# Patient Record
Sex: Female | Born: 1980
Health system: Southern US, Community
[De-identification: ages and names within clinical notes are randomized; demographics above are authoritative.]

## PROBLEM LIST (undated history)

## (undated) ENCOUNTER — Ambulatory Visit (HOSPITAL_COMMUNITY): Admission: EM | Payer: Federal, State, Local not specified - PPO

## (undated) DIAGNOSIS — Z87442 Personal history of urinary calculi: Secondary | ICD-10-CM

## (undated) DIAGNOSIS — Q394 Esophageal web: Secondary | ICD-10-CM

## (undated) DIAGNOSIS — J45909 Unspecified asthma, uncomplicated: Secondary | ICD-10-CM

## (undated) HISTORY — PX: HERNIA REPAIR: SHX51

---

## 2010-01-10 ENCOUNTER — Other Ambulatory Visit: Admission: RE | Admit: 2010-01-10 | Discharge: 2010-01-10 | Payer: Self-pay | Admitting: Obstetrics and Gynecology

## 2011-02-19 ENCOUNTER — Other Ambulatory Visit (HOSPITAL_COMMUNITY)
Admission: RE | Admit: 2011-02-19 | Discharge: 2011-02-19 | Disposition: A | Discharge status: Home / Self Care | Payer: Managed Care, Other (non HMO) | Source: Ambulatory Visit | Attending: Obstetrics and Gynecology | Admitting: Obstetrics and Gynecology

## 2011-02-19 DIAGNOSIS — Z01419 Encounter for gynecological examination (general) (routine) without abnormal findings: Secondary | ICD-10-CM | POA: Insufficient documentation

## 2011-08-31 ENCOUNTER — Emergency Department (HOSPITAL_COMMUNITY)
Admission: EM | Admit: 2011-08-31 | Discharge: 2011-09-01 | Disposition: A | Discharge status: Home / Self Care | Attending: Emergency Medicine | Admitting: Emergency Medicine

## 2011-08-31 ENCOUNTER — Encounter (HOSPITAL_COMMUNITY): Payer: Self-pay | Admitting: *Deleted

## 2011-08-31 DIAGNOSIS — Q391 Atresia of esophagus with tracheo-esophageal fistula: Secondary | ICD-10-CM | POA: Insufficient documentation

## 2011-08-31 DIAGNOSIS — R1310 Dysphagia, unspecified: Secondary | ICD-10-CM | POA: Insufficient documentation

## 2011-08-31 DIAGNOSIS — Q394 Esophageal web: Secondary | ICD-10-CM

## 2011-08-31 MED ORDER — NITROGLYCERIN 0.4 MG SL SUBL
0.4000 mg | SUBLINGUAL_TABLET | Freq: Once | SUBLINGUAL | Status: AC
Start: 2011-08-31 — End: 2011-08-31
  Administered 2011-08-31: 0.4 mg via SUBLINGUAL
  Filled 2011-08-31: qty 25

## 2011-08-31 NOTE — ED Notes (Signed)
No difference in her swallowing ability

## 2011-08-31 NOTE — ED Notes (Signed)
Patient has history of same in the past with eating a random food and she immediately threw up.  Patient states that in the past the swallowing problem would go away and today it has been a problem since 5pm and that is longer than normal.

## 2011-08-31 NOTE — ED Provider Notes (Signed)
History     CSN: [id number]  Arrival date & time 08/31/11  2123   None     Chief Complaint  Patient presents with  . Dysphagia    (Consider location/radiation/quality/duration/timing/severity/associated sxs/prior treatment) HPI Comments: Patient reports having intermittent episodes of esophageal spasm-like symptoms over the past several years.  At first he thought it was related to lying or beer consumption.  She has stopped that but now it has happened with foods tonight.  She was eating chicken nuggets, and suddenly had spasm, where she could not swallow even her own saliva.  It is painful.  She can swallow, but she feels that this is if there is something "sticking in her throat" and it is painful to swallow.  She has not seen any medical provider for this particular symptom.  She denies choking on her food, prior to this episode.  She did not have a sore throat, fever, cough, nausea, vomiting  The history is provided by the patient.    History reviewed. No pertinent past medical history.  History reviewed. No pertinent past surgical history.  History reviewed. No pertinent family history.  History  Substance Use Topics  . Smoking status: Never Smoker   . Smokeless tobacco: Not on file  . Alcohol Use: Yes    OB History    Grav Para Term Preterm Abortions TAB SAB Ect Mult Living                  Review of Systems  Constitutional: Negative for fever and chills.  HENT: Positive for trouble swallowing. Negative for sore throat, rhinorrhea, drooling and voice change.   Respiratory: Negative for cough, choking and shortness of breath.   Cardiovascular: Negative for chest pain.  Neurological: Negative for dizziness and weakness.  Hematological: Negative for adenopathy.    Allergies  Review of patient's allergies indicates no known allergies.  Home Medications  No current outpatient prescriptions on file.  BP 128/76  Pulse 60  Temp(Src) 98.9 F (37.2 C) (Oral)   Resp 20  SpO2 100%  LMP 09/01/2011  Physical Exam  Constitutional: She is oriented to person, place, and time. She appears well-developed and well-nourished.       Patient continuously spitting into a cup.  When asking her to swallow her own saliva.  She can, but she states it hurts  HENT:  Head: Normocephalic. No trismus in the jaw.  Mouth/Throat: Uvula is midline, oropharynx is clear and moist and mucous membranes are normal. No uvula swelling. No oropharyngeal exudate or posterior oropharyngeal erythema.  Eyes: Pupils are equal, round, and reactive to light.  Cardiovascular: Normal rate.   Pulmonary/Chest: Effort normal.  Musculoskeletal: Normal range of motion.  Neurological: She is alert and oriented to person, place, and time.  Skin: Skin is warm and dry.    ED Course  Procedures (including critical care time)  Labs Reviewed - No data to display Dg Neck Soft Tissue  09/01/2011  *RADIOLOGY REPORT*  Clinical Data: Difficulty swallowing after choking on a chicken nugget 1 day ago.  NECK SOFT TISSUES - 1+ VIEW  Comparison: None.  Findings: No radiopaque foreign bodies demonstrated in the neck. Visualized hypopharynx and cervical airway appear intact. Aryepiglottic fold thickening is not excluded.  No prevertebral or submental soft tissue swelling.  IMPRESSION: No radiopaque foreign bodies demonstrated.  Original Report Authenticated By: Marlon Pel, M.D.   Dg Esophagus  09/01/2011  *RADIOLOGY REPORT*  Clinical Data: Choking sensation yesterday with subsequent pain and  inability to swallow.  BARIUM SWALLOW/ESOPHAGRAM  Technique: Single contrast esophagram was obtained with water- soluble contrast material.  Images were obtained in the upright and supine positions.  Comparison:  None.  Findings: Normal primary esophageal peristalsis with free flow of contrast material throughout the cervical esophagus to the distal esophagus and into the stomach.  No evidence of focal obstruction. No  intraluminal filling defects are demonstrated.  There is suggestion of a small anterior filling defect in the proximal cervical esophagus which could represent a small cervical web. Normal esophageal fold pattern.  No evidence of ulceration or esophageal mass.  IMPRESSION: No evidence of esophageal obstruction or dysmotility.  Small anterior defect in the upper cervical esophagus might represent a small cervical web.  Original Report Authenticated By: Marlon Pel, M.D.     1. Esophageal tissue web       MDM  I believe that this is esophageal, spasm.  We'll try a sublingual nitroglycerin and reassess patient Patient had no relief with the sublingual nitroglycerin.  She still has persisted in spitting saliva.  Will ask that an IV be placed administer Protonix and a half a milligram of Ativan and,Solumederol  then we'll reassess Spoke with DR. 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  [facility], encourage her to eat pured or liquid food until she has further evaluation to prevent food boluses or similar situations as tonight She is now drinking fluids without difficulty        Arman Filter, NP 09/01/11 [phone]

## 2011-08-31 NOTE — ED Notes (Signed)
The pt  Has been given sl nitro

## 2011-08-31 NOTE — ED Notes (Signed)
The pt has had difficulty swallowing since 1700.  She has had this in the past and she cannot swallow her saliva

## 2011-09-01 ENCOUNTER — Emergency Department (HOSPITAL_COMMUNITY)

## 2011-09-01 MED ORDER — PANTOPRAZOLE SODIUM 40 MG IV SOLR
40.0000 mg | Freq: Once | INTRAVENOUS | Status: AC
  Administered 2011-09-01: 40 mg via INTRAVENOUS
  Filled 2011-09-01: qty 40

## 2011-09-01 MED ORDER — LORAZEPAM 2 MG/ML IJ SOLN
0.5000 mg | Freq: Once | INTRAMUSCULAR | Status: AC
  Administered 2011-09-01: 2 mg via INTRAVENOUS
  Filled 2011-09-01: qty 1

## 2011-09-01 MED ORDER — METHYLPREDNISOLONE SODIUM SUCC 125 MG IJ SOLR
125.0000 mg | Freq: Once | INTRAMUSCULAR | Status: AC
  Administered 2011-09-01: 125 mg via INTRAVENOUS
  Filled 2011-09-01: qty 2

## 2011-09-01 MED ORDER — SODIUM CHLORIDE 0.9 % IV BOLUS (SEPSIS)
1000.0000 mL | Freq: Once | INTRAVENOUS | Status: AC
  Administered 2011-09-01: 1000 mL via INTRAVENOUS

## 2011-09-01 MED ORDER — GLUCAGON HCL (RDNA) 1 MG IJ SOLR
1.0000 mg | Freq: Once | INTRAMUSCULAR | Status: AC
  Administered 2011-09-01: 1 mg via INTRAVENOUS
  Filled 2011-09-01: qty 1

## 2011-09-01 NOTE — ED Notes (Signed)
Pt st's she still is unable to swallow and continues to spit in cup.  No resp. Problems noted

## 2011-09-01 NOTE — ED Provider Notes (Signed)
Medical screening examination/treatment/procedure(s) were performed by non-physician practitioner and as supervising physician I was immediately available for consultation/collaboration.    Diann Bangerter R Climmie Buelow, MD 09/01/11 0631 

## 2011-09-01 NOTE — ED Notes (Signed)
Pt states understanding of discharge instructions 

## 2011-09-01 NOTE — ED Notes (Signed)
GLucagon admx  ivp. Site is benign. Pt continues to not tolerate ice, advised to no longer eat ice. FNP at bedside and explaining plan of care. Pt continues to converses with ease. Friend at bedside.

## 2014-03-01 ENCOUNTER — Other Ambulatory Visit: Payer: Self-pay | Admitting: Emergency Medicine

## 2014-03-01 DIAGNOSIS — R531 Weakness: Secondary | ICD-10-CM

## 2014-03-08 ENCOUNTER — Ambulatory Visit
Admission: RE | Admit: 2014-03-08 | Discharge: 2014-03-08 | Disposition: A | Discharge status: Home / Self Care | Source: Ambulatory Visit | Attending: Emergency Medicine | Admitting: Emergency Medicine

## 2014-03-08 DIAGNOSIS — R531 Weakness: Secondary | ICD-10-CM

## 2014-03-08 MED ORDER — GADOBENATE DIMEGLUMINE 529 MG/ML IV SOLN
13.0000 mL | Freq: Once | INTRAVENOUS | Status: AC | PRN
  Administered 2014-03-08: 13 mL via INTRAVENOUS

## 2015-07-07 ENCOUNTER — Emergency Department (HOSPITAL_COMMUNITY)
Admission: EM | Admit: 2015-07-07 | Discharge: 2015-07-07 | Disposition: A | Discharge status: Home / Self Care | Payer: Federal, State, Local not specified - PPO | Attending: Emergency Medicine | Admitting: Emergency Medicine

## 2015-07-07 ENCOUNTER — Encounter (HOSPITAL_COMMUNITY): Payer: Self-pay | Admitting: Emergency Medicine

## 2015-07-07 DIAGNOSIS — Y9289 Other specified places as the place of occurrence of the external cause: Secondary | ICD-10-CM | POA: Insufficient documentation

## 2015-07-07 DIAGNOSIS — Q394 Esophageal web: Secondary | ICD-10-CM | POA: Diagnosis not present

## 2015-07-07 DIAGNOSIS — Y9389 Activity, other specified: Secondary | ICD-10-CM | POA: Diagnosis not present

## 2015-07-07 DIAGNOSIS — F419 Anxiety disorder, unspecified: Secondary | ICD-10-CM | POA: Diagnosis not present

## 2015-07-07 DIAGNOSIS — R1310 Dysphagia, unspecified: Secondary | ICD-10-CM | POA: Diagnosis present

## 2015-07-07 DIAGNOSIS — T18128A Food in esophagus causing other injury, initial encounter: Secondary | ICD-10-CM | POA: Diagnosis not present

## 2015-07-07 DIAGNOSIS — X58XXXA Exposure to other specified factors, initial encounter: Secondary | ICD-10-CM | POA: Diagnosis not present

## 2015-07-07 DIAGNOSIS — Y998 Other external cause status: Secondary | ICD-10-CM | POA: Insufficient documentation

## 2015-07-07 DIAGNOSIS — K222 Esophageal obstruction: Secondary | ICD-10-CM

## 2015-07-07 HISTORY — DX: Esophageal web: Q39.4

## 2015-07-07 MED ORDER — ONDANSETRON HCL 4 MG/2ML IJ SOLN
4.0000 mg | Freq: Once | INTRAMUSCULAR | Status: AC
  Administered 2015-07-07: 4 mg via INTRAVENOUS
  Filled 2015-07-07: qty 2

## 2015-07-07 MED ORDER — ONDANSETRON 4 MG PO TBDP
4.0000 mg | ORAL_TABLET | Freq: Three times a day (TID) | ORAL | Status: DC | PRN
Start: 2015-07-07 — End: 2016-04-07

## 2015-07-07 MED ORDER — NITROGLYCERIN 0.4 MG SL SUBL
0.4000 mg | SUBLINGUAL_TABLET | Freq: Once | SUBLINGUAL | Status: AC
  Administered 2015-07-07: 0.4 mg via SUBLINGUAL
  Filled 2015-07-07: qty 1

## 2015-07-07 MED ORDER — SODIUM CHLORIDE 0.9 % IV SOLN
INTRAVENOUS | Status: DC
  Administered 2015-07-07: 02:00:00 via INTRAVENOUS

## 2015-07-07 MED ORDER — PANTOPRAZOLE SODIUM 40 MG PO TBEC: 40.0000 mg | DELAYED_RELEASE_TABLET | Freq: Every day | ORAL | Status: DC

## 2015-07-07 MED ORDER — PANTOPRAZOLE SODIUM 40 MG IV SOLR
40.0000 mg | Freq: Once | INTRAVENOUS | Status: AC
  Administered 2015-07-07: 40 mg via INTRAVENOUS
  Filled 2015-07-07: qty 40

## 2015-07-07 MED ORDER — GLUCAGON HCL RDNA (DIAGNOSTIC) 1 MG IJ SOLR
1.0000 mg | Freq: Once | INTRAMUSCULAR | Status: AC
  Administered 2015-07-07: 1 mg via INTRAVENOUS
  Filled 2015-07-07: qty 1

## 2015-07-07 NOTE — Discharge Instructions (Signed)
Soft-Food Meal Plan A soft-food meal plan includes foods that are safe and easy to swallow. This meal plan typically is used:  If you are having trouble chewing or swallowing foods.  As a transition meal plan after only having had liquid meals for a long period. WHAT DO I NEED TO KNOW ABOUT THE SOFT-FOOD MEAL PLAN? A soft-food meal plan includes tender foods that are soft and easy to chew and swallow. In most cases, bite-sized pieces of food are easier to swallow. A bite-sized piece is about  inch or smaller. Foods in this plan do not need to be ground or pureed. Foods that are very hard, crunchy, or sticky should be avoided. Also, breads, cereals, yogurts, and desserts with nuts, seeds, or fruits should be avoided. WHAT FOODS CAN I EAT? Grains Rice and wild rice. Moist bread, dressing, pasta, and noodles. Well-moistened dry or cooked cereals, such as farina (cooked wheat cereal), oatmeal, or grits. Biscuits, breads, muffins, pancakes, and waffles that have been well moistened. Vegetables Shredded lettuce. Cooked, tender vegetables, including potatoes without skins. Vegetable juices. Broths or creamed soups made with vegetables that are not stringy or chewy. Strained tomatoes (without seeds). Fruits Canned or well-cooked fruits. Soft (ripe), peeled fresh fruits, such as peaches, nectarines, kiwi, cantaloupe, honeydew melon, and watermelon (without seeds). Soft berries with small seeds, such as strawberries. Fruit juices (without pulp). Meats and Other Protein Sources Moist, tender, lean beef. Mutton. Lamb. Veal. Chicken. Turkey. Liver. Ham.[place] Fish without bones. Eggs. Dairy Milk, milk drinks, and cream. Plain cream cheese and cottage cheese. Plain yogurt. Sweets/Desserts Flavored gelatin desserts. Custard. Plain ice cream, frozen yogurt, sherbet, milk shakes, and malts. Plain cakes and cookies. Plain hard candy.  Other Butter, margarine (without trans fat), and cooking oils. Mayonnaise. Cream  sauces. Mild spices, salt, and sugar. Syrup, molasses, honey, and jelly. The items listed above may not be a complete list of recommended foods or beverages. Contact your dietitian for more options. WHAT FOODS ARE NOT RECOMMENDED? Grains Dry bread, toast, crackers that have not been moistened. Coarse or dry cereals, such as bran, granola, and shredded wheat. Tough or chewy crusty breads, such as [place] bread or baguettes. Vegetables Corn. Raw vegetables except shredded lettuce. Cooked vegetables that are tough or stringy. Tough, crisp, fried potatoes and potato skins. Fruits Fresh fruits with skins or seeds or both, such as apples, pears, or grapes. Stringy, high-pulp fruits, such as papaya, pineapple, coconut, or mango. Fruit leather, fruit roll-ups, and all dried fruits. Meats and Other Protein Sources Sausages and hot dogs. Meats with gristle. Fish with bones. Nuts, seeds, and chunky peanut or other nut butters. Sweets/Desserts Cakes or cookies that are very dry or chewy.  The items listed above may not be a complete list of foods and beverages to avoid. Contact your dietitian for more information.   This information is not intended to replace advice given to you by your health care provider. Make sure you discuss any questions you have with your health care provider.   Document Released: 10/29/2007 Document Revised: 07/27/2013 Document Reviewed: 06/18/2013 Elsevier Interactive Patient Education 2016 [employer]   Swallowed Foreign Body, Adult/Food Bolus A swallowed foreign body is an object that gets stuck in the tube that connects your throat to your stomach (esophagus) or in another part of your digestive tract. Foreign bodies may be swallowed by accident or on purpose. When you swallow an object, it passes into your esophagus. The narrowest place in your digestive system is where your  esophagus meets your stomach. If the object can pass through that place, it will usually continue  through the rest of your digestive system without causing problems. A foreign body that gets stuck may need to be removed. It is very important to tell your health care provider what you have swallowed. Certain swallowed items can be life-threatening. You may need emergency treatment. Dangerous swallowed foreign bodies include:  Objects that get stuck in your throat.  Objects that interfere with your breathing.  Sharp objects.  Harmful objects, such as batteries or illegal drugs. CAUSES The most common swallowed foreign body is food that will not pass through your esophagus to your stomach (food impaction). Foods that commonly become impacted include meats and hard vegetables, such as carrots and radishes. Other common swallowed foreign bodies include:  Pieces of bone from meats.  Toothpicks.  Dentures. RISK FACTORS You are more likely to have a swallowed foreign body if:  You wear dentures.  You have been drinking alcohol or taking drugs.  You have a mental health condition.  You have a narrowed or scarred area in your digestive tract. SYMPTOMS  Pain or pressure in your throat or chest.  Not being able to swallow food or liquid.  Not being able to swallow your saliva.  Choking.  Hoarse voice.  Trouble breathing. DIAGNOSIS This condition may be diagnosed based on your symptoms and medical history. Your health care provider will do a physical exam to confirm the diagnosis and to find the object. Imaging studies may be done, including:  X-rays.  A CT scan. Some objects may not be seen on imaging studies. In those cases, an exam may be done using a long tubelike scope to look into your esophagus (endoscopy). The tube (endoscope) that is used for this exam may be stiff (rigid) or flexible, depending on where the foreign body is stuck. TREATMENT Usually, an object that has passed into your stomach but is not dangerous will pass out of your digestive system without  treatment. If the swallowed object is not dangerous but it is stuck in your esophagus:  You may be given medicine to relax the muscles of your esophagus to allow the object to pass through.  Endoscopy may be done to find and remove the object if it does not pass with medicine. Your health care provider will put medical instruments through the endoscope to remove the object. You may need emergency treatment if:  The object is in your esophagus and is causing you to inhale saliva into your lungs (aspirate).  The object is in your esophagus and is pressing on your airway. This makes it hard for you to breathe.  The object can damage your digestive tract. Some objects that can cause damage include batteries, magnets, sharp objects, and drugs. HOME CARE INSTRUCTIONS If the object in your digestive system is expected to pass:  Continue eating what you usually eat, unless your health care provider gives you different instructions.  Check your stool after every bowel movement to see if you have passed the object.  Contact your health care provider if the object has not passed after 3 days. If you had endoscopic surgery to remove the foreign body:  Follow instructions from your health care provider about caring for yourself after the procedure. Keep all follow-up visits and repeat imaging tests as told by your health care provider. This is important. SEEK MEDICAL CARE IF:  You continue to have symptoms of a swallowed foreign body.  The object  has not passed out of your body after 3 days. SEEK IMMEDIATE MEDICAL CARE IF:  You have a fever.  You have pain in your chest or your abdomen.  You cough up blood.  You have blood in your stool (feces) or your vomit.   This information is not intended to replace advice given to you by your health care provider. Make sure you discuss any questions you have with your health care provider.   Document Released: 01/09/2010 Document Revised:  04/12/2015 Document Reviewed: 10/19/2014 Elsevier Interactive Patient Education [employer]

## 2015-07-07 NOTE — ED Notes (Signed)
Tolerating ice chips well, given water

## 2015-07-07 NOTE — ED Provider Notes (Signed)
By signing my name below, I, Tanda Rockers, attest that this documentation has been prepared under the direction and in the presence of [employer], DO. Electronically Signed: Tanda Rockers, ED Scribe. 07/07/2015. 1:37 AM.  TIME SEEN: 1:29 AM  CHIEF COMPLAINT: Sore throat, unable to swallow  HPI:  Sheila Graham is a 34 y.o. female with hx esophageal web diagnosed on esophagram in 2013 who presents to the Emergency Department complaining of a sudden onset, constant, sensation of food stuck in her throat that began tonight at 7 PM (approximately 6.5 hours ago) while eating a fajita. Pt reports hx of similar episodes for the past several years. She usually self induces vomiting which gives her relief. Pt has tried to make herself vomit tonight without relief. She is also unable to tolerate her secretions, prompting her to come to the ED tonight. Denies shortness of breath or any other associated symptoms. No changes in her voice. Pt does not follow with a GI. No history of endoscopy.   ROS: See HPI Constitutional: no fever  Eyes: no drainage  ENT: foreign body sensation in throat. no runny nose   Cardiovascular:  no chest pain  Resp: no SOB  GI: no vomiting GU: no dysuria Integumentary: no rash  Allergy: no hives  Musculoskeletal: no leg swelling  Neurological: no slurred speech ROS otherwise negative  PAST MEDICAL HISTORY/PAST SURGICAL HISTORY:  Past Medical History  Diagnosis Date  . Esophageal web     MEDICATIONS:  Prior to Admission medications   Not on File    ALLERGIES:  No Known Allergies  SOCIAL HISTORY:  Social History  Substance Use Topics  . Smoking status: Never Smoker   . Smokeless tobacco: Not on file  . Alcohol Use: Yes    FAMILY HISTORY: No family history on file.  EXAM: Triage Vitals: BP 162/98 mmHg  Pulse 80  Temp(Src) 97.8 F (36.6 C) (Oral)  Resp 16  SpO2 100%  LMP 06/16/2015 (Approximate)   CONSTITUTIONAL: Alert and oriented and  responds appropriately to questions. Well-appearing; well-nourished. Appears anxious.  HEAD: Normocephalic EYES: Conjunctivae clear, PERRL ENT: normal nose; no rhinorrhea; moist mucous membranes; pharynx without lesions noted, no tonsillar hypertrophy or exudate, normal phonation and no stridor, pt unable to swallow her own secretions and is actively spitting, no trismus or drooling NECK: Supple, no meningismus, no LAD  CARD: RRR; S1 and S2 appreciated; no murmurs, no clicks, no rubs, no gallops RESP: Normal chest excursion without splinting or tachypnea; breath sounds clear and equal bilaterally; no wheezes, no rhonchi, no rales, no hypoxia or respiratory distress, speaking full sentences ABD/GI: Normal bowel sounds; non-distended; soft, non-tender, no rebound, no guarding, no peritoneal signs BACK:  The back appears normal and is non-tender to palpation, there is no CVA tenderness EXT: Normal ROM in all joints; non-tender to palpation; no edema; normal capillary refill; no cyanosis, no calf tenderness or swelling    SKIN: Normal color for age and race; warm NEURO: Moves all extremities equally, sensation to light touch intact diffusely, cranial nerves II through XII intact PSYCH: The patient's mood and manner are appropriate. Grooming and personal hygiene are appropriate.  MEDICAL DECISION MAKING: Patient here with likely esophageal food bolus. We'll give nitroglycerin, glucagon, Protonix, Zofran and fluids. No respiratory distress. Normal phonation.  ED PROGRESS: 3:00 AM  Pt reports feeling much better after nitroglycerin, Protonix, Zofran and glucagon. She is now able to swallow her saliva and is drinking without difficulty. We'll discharge patient home with prescriptions  for Protonix and Zofran to use as needed and outpatient gastroenterology follow-up information. Discussed return precautions. She verbalized understanding and is comfortable with this plan.    I personally performed the  services described in this documentation, which was scribed in my presence. The recorded information has been reviewed and is accurate.    Layla MawKristen N Marytza Grandpre, DO 07/07/15 [phone]

## 2015-07-07 NOTE — ED Notes (Signed)
Pt states that her throat feels much better, pt given ice chips, tolerating well.

## 2015-07-07 NOTE — ED Notes (Signed)
Pt. reports sore throat / hard to swallow intermittently every week and this evening , pt. states history of " esophageal web/net" , airway intact / respirations unlabored .

## 2016-04-07 ENCOUNTER — Encounter (HOSPITAL_COMMUNITY)
Admission: EM | Disposition: A | Discharge status: Home / Self Care | Payer: Self-pay | Source: Home / Self Care | Attending: Emergency Medicine

## 2016-04-07 ENCOUNTER — Ambulatory Visit (HOSPITAL_COMMUNITY)
Admission: EM | Admit: 2016-04-07 | Discharge: 2016-04-08 | Disposition: A | Discharge status: Home / Self Care | Payer: Federal, State, Local not specified - PPO | Attending: Emergency Medicine | Admitting: Emergency Medicine

## 2016-04-07 ENCOUNTER — Encounter (HOSPITAL_COMMUNITY): Payer: Self-pay | Admitting: Emergency Medicine

## 2016-04-07 DIAGNOSIS — K449 Diaphragmatic hernia without obstruction or gangrene: Secondary | ICD-10-CM | POA: Insufficient documentation

## 2016-04-07 DIAGNOSIS — K209 Esophagitis, unspecified: Secondary | ICD-10-CM | POA: Insufficient documentation

## 2016-04-07 DIAGNOSIS — X58XXXA Exposure to other specified factors, initial encounter: Secondary | ICD-10-CM | POA: Insufficient documentation

## 2016-04-07 DIAGNOSIS — T18128A Food in esophagus causing other injury, initial encounter: Secondary | ICD-10-CM

## 2016-04-07 DIAGNOSIS — K222 Esophageal obstruction: Secondary | ICD-10-CM | POA: Diagnosis not present

## 2016-04-07 HISTORY — PX: ESOPHAGOGASTRODUODENOSCOPY: SHX5428

## 2016-04-07 LAB — I-STAT CHEM 8, ED
BUN: 18 mg/dL (ref 6–20)
Calcium, Ion: 1.18 mmol/L (ref 1.15–1.40)
Chloride: 104 mmol/L (ref 101–111)
Creatinine, Ser: 1 mg/dL (ref 0.44–1.00)
Glucose, Bld: 80 mg/dL (ref 65–99)
HCT: 46 % (ref 36.0–46.0)
Hemoglobin: 15.6 g/dL — ABNORMAL HIGH (ref 12.0–15.0)
Potassium: 3.9 mmol/L (ref 3.5–5.1)
Sodium: 139 mmol/L (ref 135–145)
TCO2: 26 mmol/L (ref 0–100)

## 2016-04-07 LAB — I-STAT BETA HCG BLOOD, ED (MC, WL, AP ONLY): I-stat hCG, quantitative: <5 m[IU]/mL (ref <5)

## 2016-04-07 SURGERY — EGD (ESOPHAGOGASTRODUODENOSCOPY)
Anesthesia: Moderate Sedation

## 2016-04-07 MED ORDER — DIPHENHYDRAMINE HCL 50 MG/ML IJ SOLN
INTRAMUSCULAR | Status: DC | PRN
  Administered 2016-04-07 (×2): 12.5 mg via INTRAVENOUS

## 2016-04-07 MED ORDER — ONDANSETRON HCL 4 MG/2ML IJ SOLN
4.0000 mg | Freq: Once | INTRAMUSCULAR | Status: AC
  Administered 2016-04-07: 4 mg via INTRAVENOUS
  Filled 2016-04-07: qty 2

## 2016-04-07 MED ORDER — MIDAZOLAM HCL 10 MG/2ML IJ SOLN
INTRAMUSCULAR | Status: DC | PRN
  Administered 2016-04-07: 2 mg via INTRAVENOUS
  Administered 2016-04-07: 1 mg via INTRAVENOUS
  Administered 2016-04-07 (×3): 2 mg via INTRAVENOUS

## 2016-04-07 MED ORDER — PANTOPRAZOLE SODIUM 40 MG IV SOLR: 40.0000 mg | Freq: Two times a day (BID) | INTRAVENOUS | Status: DC

## 2016-04-07 MED ORDER — DIPHENHYDRAMINE HCL 50 MG/ML IJ SOLN
INTRAMUSCULAR | Status: AC
  Filled 2016-04-07: qty 1

## 2016-04-07 MED ORDER — SODIUM CHLORIDE 0.9 % IV BOLUS (SEPSIS)
1000.0000 mL | Freq: Once | INTRAVENOUS | Status: AC
  Administered 2016-04-07: 1000 mL via INTRAVENOUS

## 2016-04-07 MED ORDER — FENTANYL CITRATE (PF) 100 MCG/2ML IJ SOLN
INTRAMUSCULAR | Status: DC | PRN
  Administered 2016-04-07 (×4): 25 ug via INTRAVENOUS

## 2016-04-07 MED ORDER — MIDAZOLAM HCL 5 MG/ML IJ SOLN
INTRAMUSCULAR | Status: AC
  Filled 2016-04-07: qty 2

## 2016-04-07 MED ORDER — GLUCAGON HCL RDNA (DIAGNOSTIC) 1 MG IJ SOLR
1.0000 mg | Freq: Once | INTRAMUSCULAR | Status: AC
  Administered 2016-04-07: 1 mg via INTRAVENOUS
  Filled 2016-04-07: qty 1

## 2016-04-07 MED ORDER — DIAZEPAM 5 MG/ML IJ SOLN
5.0000 mg | Freq: Once | INTRAMUSCULAR | Status: AC
  Administered 2016-04-07: 5 mg via INTRAVENOUS
  Filled 2016-04-07: qty 2

## 2016-04-07 MED ORDER — BUTAMBEN-TETRACAINE-BENZOCAINE 2-2-14 % EX AERO
INHALATION_SPRAY | CUTANEOUS | Status: DC | PRN
  Administered 2016-04-07: 2 via TOPICAL

## 2016-04-07 MED ORDER — FENTANYL CITRATE (PF) 100 MCG/2ML IJ SOLN
INTRAMUSCULAR | Status: AC
  Filled 2016-04-07: qty 2

## 2016-04-07 NOTE — ED Notes (Signed)
Pt states she frequently gets food boluses stuck, states about once per week. States she's usually able to get it up through inducing vomiting or with "fizzy" beverages. States neither technique worked this time. States she has been told she needs an endoscopic procedure.

## 2016-04-07 NOTE — ED Notes (Signed)
MD at bedside. 

## 2016-04-07 NOTE — ED Triage Notes (Signed)
Patient was at a [employer] today, swallowed pork that has been lodged in esophagus.  Patient is unable to swallow saliva.  Patient has no problems breathing at this time.

## 2016-04-07 NOTE — ED Provider Notes (Signed)
[facility] DEPT Provider Note   CSN: [id number] Arrival date & time: 04/07/16  1950    History   Chief Complaint Chief Complaint  Patient presents with  . Foreign Body    HPI Sheila Graham is a 35 y.o. female.  HPI  Patient is a 35 year old female with a past history of stuck food boluses who comes in today complaining of a stuck food bolus. Patient states she was at a potluck when she swallowed a piece of barbecue which she states feels it is lodged in her esophagus. Patient's been unable to swallow any saliva since that time. Patient denies any other symptoms.  Past Medical History:  Diagnosis Date  . Esophageal web     There are no active problems to display for this patient.   Past Surgical History:  Procedure Laterality Date  . ESOPHAGOGASTRODUODENOSCOPY N/A 04/07/2016   Procedure: ESOPHAGOGASTRODUODENOSCOPY (EGD);  Surgeon: Kathi DerParag Brahmbhatt, MD;  [facility];  Service: Gastroenterology;  Laterality: N/A;    OB History    No data available       Home Medications    Prior to Admission medications   Medication Sig Start Date End Date Taking? Authorizing Provider  naproxen sodium (ANAPROX) 220 MG tablet Take 220-440 mg by mouth 2 (two) times daily as needed (headache).   Yes Historical Provider, MD  pantoprazole (PROTONIX) 40 MG tablet Take 1 tablet (40 mg total) by mouth 2 (two) times daily. 04/08/16   Tomasita CrumbleAdeleke Oni, MD    Family History History reviewed. No pertinent family history.  Social History Social History  Substance Use Topics  . Smoking status: Never Smoker  . Smokeless tobacco: Never Used  . Alcohol use Yes     Allergies   Review of patient's allergies indicates no known allergies.   Review of Systems Review of Systems  Constitutional: Negative for chills and fever.  HENT: Positive for trouble swallowing. Negative for congestion and facial swelling.   Respiratory: Negative for shortness of breath.   Gastrointestinal: Negative  for abdominal pain.  All other systems reviewed and are negative.    Physical Exam Updated Vital Signs BP (!) 96/54   Pulse 79   Temp 98.7 F (37.1 C) (Oral)   Resp 19   LMP 03/24/2016 (Approximate)   SpO2 99%   Physical Exam  Constitutional: She appears well-developed and well-nourished. No distress.  HENT:  Head: Normocephalic and atraumatic.  Eyes: Conjunctivae are normal.  Neck: Neck supple.  Cardiovascular: Normal rate and regular rhythm.   No murmur heard. Pulmonary/Chest: Effort normal and breath sounds normal. No respiratory distress.  Abdominal: Soft. There is no tenderness.  Musculoskeletal: She exhibits no edema.  Neurological: She is alert.  Skin: Skin is warm and dry.  Psychiatric: She has a normal mood and affect.  Nursing note and vitals reviewed.    ED Treatments / Results  Labs (all labs ordered are listed, but only abnormal results are displayed) Labs Reviewed  I-STAT CHEM 8, ED - Abnormal; Notable for the following:       Result Value   Hemoglobin 15.6 (*)    All other components within normal limits  I-STAT BETA HCG BLOOD, ED ([facility], [facility], [facility] ONLY)  SURGICAL PATHOLOGY    EKG  EKG Interpretation None       Radiology No results found.  Procedures Procedures (including critical care time)  Medications Ordered in ED Medications  sodium chloride 0.9 % bolus 1,000 mL (0 mLs Intravenous Stopped 04/07/16 2243)  glucagon (human  recombinant) (GLUCAGEN) injection 1 mg (1 mg Intravenous Given 04/07/16 2116)  ondansetron [facility]) injection 4 mg (4 mg Intravenous Given 04/07/16 2115)  diazepam (VALIUM) injection 5 mg (5 mg Intravenous Given 04/07/16 2142)     Initial Impression / Assessment and Plan / ED Course  I have reviewed the triage vital signs and the nursing notes.  Pertinent labs & imaging results that were available during my care of the patient were reviewed by me and considered in my medical decision making (see chart for  details).  Clinical Course  Comment By Time  Pt seen and examined.  Hx of recurrent dysphagia.  Managed at home.  Now it has not passed pat at all since 6pm.   Linwood Dibbles, MD 09/03 2108    Patient is a 35 year old female with a past history of stuck food boluses who comes in today complaining of a stuck food bolus. Patient states she was at a potluck when she swallowed a piece of barbecue which she states feels it is lodged in her esophagus. Patient's been unable to swallow any saliva since that time. Patient denies any other symptoms.  PE: WNL  We'll give patient Valium and glucagon.  Pt did not have relief from above. GI consulted and have agreed to admit for EGD.  Final Clinical Impressions(s) / ED Diagnoses   Final diagnoses:  Food impaction of esophagus, initial encounter    New Prescriptions Discharge Medication List as of 04/08/2016 12:12 AM    START taking these medications   Details  pantoprazole (PROTONIX) 40 MG tablet Take 1 tablet (40 mg total) by mouth 2 (two) times daily., Starting Mon 04/08/2016, Print         Caren Griffins, MD 04/10/16 1610    Linwood Dibbles, MD 04/11/16 [id number]

## 2016-04-07 NOTE — ED Notes (Signed)
Endo at bedside

## 2016-04-07 NOTE — Op Note (Signed)
[facility] Patient Name: Sheila Graham Procedure Date : 04/07/2016 MRN: [id number] Attending MD: Kathi Der , MD Date of Birth: Nov 03, 1980 CSN: [id number] Age: 35 Admit Type: Outpatient Procedure:                Upper GI endoscopy Indications:              Foreign body in the esophagus Providers:                Kathi Der, MD, Sarah Monday RN, RN, Lorenda Ishihara, Technician Referring MD:              Medicines:                Fentanyl 100 micrograms IV, Midazolam 9 mg IV,                            Diphenhydramine 25 mg IV Complications:            No immediate complications. Estimated Blood Loss:     Estimated blood loss: none. Procedure:                Pre-Anesthesia Assessment:                           - Prior to the procedure, a History and Physical                            was performed, and patient medications and                            allergies were reviewed. The patient's tolerance of                            previous anesthesia was also reviewed. The risks                            and benefits of the procedure and the sedation                            options and risks were discussed with the patient.                            All questions were answered, and informed consent                            was obtained. Prior Anticoagulants: The patient has                            taken no previous anticoagulant or antiplatelet                            agents. ASA Grade Assessment: I - A normal, healthy  patient. After reviewing the risks and benefits,                            the patient was deemed in satisfactory condition to                            undergo the procedure.                           After obtaining informed consent, the endoscope was                            passed under direct vision. Throughout the                            procedure, the patient's blood  pressure, pulse, and                            oxygen saturations were monitored continuously. The                            [id number]-[id number] ([id number]) scope was introduced through the                            mouth, and advanced to the second part of duodenum.                            The upper GI endoscopy was somewhat difficult due                            to the patient's anxiety. Successful completion of                            the procedure was aided by increasing the dose of                            sedation medication. The patient tolerated the                            procedure well. Scope In: Scope Out: Findings:      Food was found in the lower third of the esophagus. it was removed using       a Roth net. Small amount of retained food bolus was pushed down into the       stomach while removing with a Roth net. Patient was found to have her       lower esophageal stricture. Dilatation was not performed at this time.      Mucosal changes including ringed esophagus and stenosis were found in       the lower third of the esophagus suggestive of eosinophilic esophagitis.       Biopsies were taken with a cold forceps for histology.      A small hiatal hernia was present.      The entire examined stomach was normal.      The cardia and gastric fundus  were normal on retroflexion.      The examined duodenum was normal. Impression:               - Food in the lower third of the esophagus.                           - Esophageal mucosal changes suggestive of                            eosinophilic esophagitis. Biopsied.                           - Small hiatal hernia.                           - Normal stomach.                           - Normal examined duodenum. Moderate Sedation:      Moderate (conscious) sedation was administered by the endoscopy nurse       and supervised by the endoscopist. The following parameters were       monitored: oxygen saturation, heart rate,  blood pressure, and response       to care. Recommendation:           - Discharge patient to home (ambulatory).                           - Soft diet.                           - Use Protonix (pantoprazole) 40 mg PO BID for 8                            weeks.                           - Repeat upper endoscopy in 8 weeks for possible                            dilatation in 8-10 weeks.                           - Return to GI office in 4 weeks.                           - Continue present medications. Procedure Code(s):        --- Professional ---                           [phone], Esophagogastroduodenoscopy, flexible,                            transoral; with biopsy, single or multiple Diagnosis Code(s):        --- Professional ---                           W41.324M, Food in esophagus  causing other injury,                            initial encounter                           K20.9, Esophagitis, unspecified                           K44.9, Diaphragmatic hernia without obstruction or                            gangrene                           T18.108A, Unspecified foreign body in esophagus                            causing other injury, initial encounter CPT copyright 2016 American Medical Association. All rights reserved. The codes documented in this report are preliminary and upon coder review may  be revised to meet current compliance requirements. Kathi Der, MD Kathi Der, MD 04/07/2016 11:56:45 PM Number of Addenda: 0

## 2016-04-07 NOTE — Consult Note (Signed)
Referring Provider:  Caren GriffinsLuckey, Sheila  Primary Care Physician:  No primary care provider on file. Primary Gastroenterologist:  None   Reason for Consultation:  food impaction  HPI: Sheila Graham is a [age] y.o. female came into emergency room with food impaction. As per patient's she was eating [employer]ien[employer] around 6 PM when she noted food getting stuck in the esophagus. Since that time patient is not able to eat or drink anything. Not able to swallow her saliva. Constantly spitting up .Marland Kitchen. Patient with history of on-and-off dysphagia since last 1 year which has been getting worse since last few weeks. Dysphagia mostly to solid foods. Denied any acid reflux. Denied abdominal pain. Denied any other GI symptoms. No significant allergies. Does not take any medications. No prior endoscopy.  Past Medical History:  Diagnosis Date  . Esophageal web     History reviewed. No pertinent surgical history.  Prior to Admission medications   Medication Sig Start Date End Date Taking? Authorizing Provider  naproxen sodium (ANAPROX) 220 MG tablet Take 220-440 mg by mouth 2 (two) times daily as needed (headache).   Yes Historical Provider, MD    Scheduled Meds: . [START ON 04/11/2016] pantoprazole  40 mg Intravenous Q12H   Continuous Infusions:  PRN Meds:.  Allergies as of 04/07/2016  . (No Known Allergies)    History reviewed. No pertinent family history.  Social History   Social History  . Marital status: Unknown    Spouse name: N/A  . Number of children: N/A  . Years of education: N/A   Occupational History  . Not on file.   Social History Main Topics  . Smoking status: Never Smoker  . Smokeless tobacco: Never Used  . Alcohol use Yes  . Drug use: Unknown  . Sexual activity: Not on file   Other Topics Concern  . Not on file   Social History Narrative  . No narrative on file    Review of Systems: All negative except as stated above in HPI.  Physical Exam: Vital signs: Vitals:   04/07/16  2045 04/07/16 2145  BP: 122/86 121/89  Pulse: 68 76  Resp: 13 16  Temp:       General:   Alert,  Well-developed, well-nourished, pleasant and cooperative in NAD Lungs:  Clear throughout to auscultation.   No wheezes, crackles, or rhonchi. No acute distress. Heart:  Regular rate and rhythm; no murmurs, clicks, rubs,  or gallops. Abdomen: Soft, nontender, nondistended gallstone present Lower Eximetry. No edema  GI:  Lab Results:  Recent Labs  04/07/16 2124  HGB 15.6*  HCT 46.0   BMET  Recent Labs  04/07/16 2124  NA 139  K 3.9  CL 104  GLUCOSE 80  BUN 18  CREATININE 1.00   LFT No results for input(s): PROT, ALBUMIN, AST, ALT, ALKPHOS, BILITOT, BILIDIR, IBILI in the last 72 hours. PT/INR No results for input(s): LABPROT, INR in the last 72 hours.   Studies/Results: No results found.  Impression/Plan: Food impaction. History of dysphagia to solid foods since last 1 year  Recommendations -------------------------- - IV PPI - Emergent EGD. Risks benefits and alternatives discussed with the patient. Patient is at high-risk of esohageal perforation. Discussed with the patient . Verbalized understanding  Patient was seen in the emergency room.Critical care time around 40 minutes.   LOS: 0 days   Sheila Graham  04/07/2016, 10:43 PM  Pager [phone] If no answer or after 5 PM call [phone]

## 2016-04-08 ENCOUNTER — Encounter (HOSPITAL_COMMUNITY): Payer: Self-pay | Admitting: Gastroenterology

## 2016-04-08 DIAGNOSIS — K2210 Ulcer of esophagus without bleeding: Secondary | ICD-10-CM | POA: Diagnosis not present

## 2016-04-08 MED ORDER — PANTOPRAZOLE SODIUM 40 MG PO TBEC: 40.0000 mg | DELAYED_RELEASE_TABLET | Freq: Two times a day (BID) | ORAL | 0 refills | Status: DC

## 2016-04-08 NOTE — ED Notes (Signed)
Patient left at this time with all belongings. 

## 2016-04-09 NOTE — ED Provider Notes (Signed)
Pt is a 35 y.o. female who presents with  Chief Complaint  Patient presents with  . Foreign Body  Pt presented to the ED with an esophageal food impaction. She has a history of similar symptoms previously but never had to have an endoscopy and has not seen any GI doctor as an outpatient.  Physical Exam  Constitutional: No distress.  HENT:  Head: Normocephalic and atraumatic.  speaking without difficulty  Eyes: Conjunctivae are normal. Left eye exhibits no discharge. No scleral icterus.  Neck: No tracheal deviation present. No thyromegaly present.  Pulmonary/Chest: Effort normal and breath sounds normal. No stridor.  Abdominal: She exhibits no distension.  Unable to swallow her secretions, constantly spitting in an emesis bag  Musculoskeletal: She exhibits no tenderness or deformity.  Neurological: She is alert.  Skin: Skin is warm. No rash noted. She is not diaphoretic. No erythema.  Psychiatric: Affect normal.    Clinical Course  Comment By Time  Pt seen and examined.  Hx of recurrent dysphagia.  Managed at home.  Now it has not passed pat at all since 6pm.   Linwood DibblesJon Ladeja Pelham, MD 09/03 2108    GI was consulted in the ED.  Pt had successful removal of the food impaction during endoscopy by Dr Levora AngelBrahmbhatt.  1. Food impaction of esophagus, initial encounter     I saw and evaluated the patient, reviewed the resident's note and I agree with the findings and plan.   Linwood DibblesJon Von Inscoe, MD 04/09/16 2052

## 2016-04-19 DIAGNOSIS — R350 Frequency of micturition: Secondary | ICD-10-CM | POA: Diagnosis not present

## 2016-04-19 DIAGNOSIS — R300 Dysuria: Secondary | ICD-10-CM | POA: Diagnosis not present

## 2016-05-07 DIAGNOSIS — H6981 Other specified disorders of Eustachian tube, right ear: Secondary | ICD-10-CM | POA: Diagnosis not present

## 2016-05-07 DIAGNOSIS — H61891 Other specified disorders of right external ear: Secondary | ICD-10-CM | POA: Diagnosis not present

## 2016-05-08 DIAGNOSIS — R1310 Dysphagia, unspecified: Secondary | ICD-10-CM | POA: Diagnosis not present

## 2016-05-08 DIAGNOSIS — K222 Esophageal obstruction: Secondary | ICD-10-CM | POA: Diagnosis not present

## 2016-05-08 DIAGNOSIS — T18128D Food in esophagus causing other injury, subsequent encounter: Secondary | ICD-10-CM | POA: Diagnosis not present

## 2016-05-22 ENCOUNTER — Encounter (HOSPITAL_COMMUNITY): Payer: Self-pay | Admitting: Emergency Medicine

## 2016-05-22 ENCOUNTER — Emergency Department (HOSPITAL_COMMUNITY)
Admission: EM | Admit: 2016-05-22 | Discharge: 2016-05-22 | Disposition: A | Discharge status: Home / Self Care | Payer: Federal, State, Local not specified - PPO | Attending: Emergency Medicine | Admitting: Emergency Medicine

## 2016-05-22 ENCOUNTER — Emergency Department (HOSPITAL_COMMUNITY): Payer: Federal, State, Local not specified - PPO

## 2016-05-22 DIAGNOSIS — G8918 Other acute postprocedural pain: Secondary | ICD-10-CM | POA: Diagnosis not present

## 2016-05-22 DIAGNOSIS — J45909 Unspecified asthma, uncomplicated: Secondary | ICD-10-CM | POA: Diagnosis not present

## 2016-05-22 DIAGNOSIS — R079 Chest pain, unspecified: Secondary | ICD-10-CM | POA: Diagnosis not present

## 2016-05-22 DIAGNOSIS — R072 Precordial pain: Secondary | ICD-10-CM | POA: Diagnosis not present

## 2016-05-22 DIAGNOSIS — R1310 Dysphagia, unspecified: Secondary | ICD-10-CM | POA: Diagnosis not present

## 2016-05-22 DIAGNOSIS — K209 Esophagitis, unspecified: Secondary | ICD-10-CM | POA: Diagnosis not present

## 2016-05-22 DIAGNOSIS — K228 Other specified diseases of esophagus: Secondary | ICD-10-CM | POA: Diagnosis not present

## 2016-05-22 HISTORY — DX: Unspecified asthma, uncomplicated: J45.909

## 2016-05-22 LAB — CBC WITH DIFFERENTIAL/PLATELET
BASOS ABS: 0 10*3/uL (ref 0.0–0.1)
BASOS PCT: 0 %
Eosinophils Absolute: 0.3 10*3/uL (ref 0.0–0.7)
Eosinophils Relative: 4 %
HEMATOCRIT: 42 % (ref 36.0–46.0)
HEMOGLOBIN: 14.1 g/dL (ref 12.0–15.0)
Lymphocytes Relative: 26 %
Lymphs Abs: 2 10*3/uL (ref 0.7–4.0)
MCH: 29.3 pg (ref 26.0–34.0)
MCHC: 33.6 g/dL (ref 30.0–36.0)
MCV: 87.3 fL (ref 78.0–100.0)
Monocytes Absolute: 0.5 10*3/uL (ref 0.1–1.0)
Monocytes Relative: 6 %
NEUTROS ABS: 5 10*3/uL (ref 1.7–7.7)
NEUTROS PCT: 64 %
Platelets: 284 10*3/uL (ref 150–400)
RBC: 4.81 MIL/uL (ref 3.87–5.11)
RDW: 13.5 % (ref 11.5–15.5)
WBC: 7.9 10*3/uL (ref 4.0–10.5)

## 2016-05-22 LAB — COMPREHENSIVE METABOLIC PANEL
ALBUMIN: 4 g/dL (ref 3.5–5.0)
ALT: 15 U/L (ref 14–54)
AST: 16 U/L (ref 15–41)
Alkaline Phosphatase: 46 U/L (ref 38–126)
Anion gap: 6 (ref 5–15)
BILIRUBIN TOTAL: 1.2 mg/dL (ref 0.3–1.2)
BUN: 9 mg/dL (ref 6–20)
CO2: 23 mmol/L (ref 22–32)
Calcium: 9 mg/dL (ref 8.9–10.3)
Chloride: 109 mmol/L (ref 101–111)
Creatinine, Ser: 0.76 mg/dL (ref 0.44–1.00)
GFR calc Af Amer: >60 mL/min (ref >60)
GFR calc non Af Amer: >60 mL/min (ref >60)
GLUCOSE: 83 mg/dL (ref 65–99)
POTASSIUM: 4 mmol/L (ref 3.5–5.1)
Sodium: 138 mmol/L (ref 135–145)
TOTAL PROTEIN: 7.2 g/dL (ref 6.5–8.1)

## 2016-05-22 LAB — I-STAT BETA HCG BLOOD, ED (MC, WL, AP ONLY): I-stat hCG, quantitative: 6 m[IU]/mL — ABNORMAL HIGH (ref <5)

## 2016-05-22 MED ORDER — IOPAMIDOL (ISOVUE-300) INJECTION 61%
INTRAVENOUS | Status: AC
  Administered 2016-05-22: 75 mL
  Filled 2016-05-22: qty 75

## 2016-05-22 MED ORDER — LIDOCAINE VISCOUS 2 % MT SOLN: 15.0000 mL | OROMUCOSAL | 0 refills | Status: DC | PRN

## 2016-05-22 MED ORDER — SODIUM CHLORIDE 0.9 % IV SOLN
Freq: Once | INTRAVENOUS | Status: AC
  Administered 2016-05-22: 14:00:00 via INTRAVENOUS

## 2016-05-22 MED ORDER — LIDOCAINE VISCOUS 2 % MT SOLN
15.0000 mL | Freq: Once | OROMUCOSAL | Status: AC
  Administered 2016-05-22: 15 mL via OROMUCOSAL
  Filled 2016-05-22: qty 15

## 2016-05-22 NOTE — Discharge Instructions (Signed)
Take the prescribed medication as directed. Recommend soft diet for the next 48-72 hours. Follow-up with your GI doctor. Return to the ED for new or worsening symptoms-- inability to tolerate fluids, fever, bloody emesis, severe pain, etc.

## 2016-05-22 NOTE — ED Triage Notes (Signed)
Was [facility] and had an esophogeal dilation and right after she c/o of sever throat pain radiating to her back,. May have esophogeal tear now

## 2016-05-22 NOTE — ED Provider Notes (Signed)
[facility]PT Provider Note   CSN: [id number] Arrival date & time: 05/22/16  1153     History   Chief Complaint No chief complaint on file.   HPI Sheila Graham is a 35 y.o. female.  The history is provided by the patient and medical records.    35 year old female with history of asthma esophageal, presenting to the ED with possible esophageal tear. Patient states she saw her GI doctor this morning, Dr. Levora Angel, and had an EGD with esophageal dilatation. There was a small tear noted during scope. Once patient woke up from anesthesia she was complaining of severe pain in her midsternal region and into her back.  States feels better laying on her side, worse lying on her back.  She was sent here for CT with concern for esophageal perforation.  States she is not having any difficulty swallowing her secretions.  Denies any chest pain or SOB.  She has not had any nausea or vomiting. No abdominal pain.  She is not currently on anticoagulation. This is her first esophageal dilatation.  Past Medical History:  Diagnosis Date  . Asthma   . Esophageal web     There are no active problems to display for this patient.   Past Surgical History:  Procedure Laterality Date  . ESOPHAGOGASTRODUODENOSCOPY N/A 04/07/2016   Procedure: ESOPHAGOGASTRODUODENOSCOPY (EGD);  Surgeon: Kathi Der, MD;  Location: [facility] ENDOSCOPY;  Service: Gastroenterology;  Laterality: N/A;    OB History    No data available       Home Medications    Prior to Admission medications   Medication Sig Start Date End Date Taking? Authorizing Provider  pantoprazole (PROTONIX) 40 MG tablet Take 1 tablet (40 mg total) by mouth 2 (two) times daily. Patient taking differently: Take 40 mg by mouth daily.  04/08/16  Yes Tomasita Crumble, MD    Family History No family history on file.  Social History Social History  Substance Use Topics  . Smoking status: Never Smoker  . Smokeless tobacco: Never Used  . Alcohol use  Yes     Allergies   Review of patient's allergies indicates no known allergies.   Review of Systems Review of Systems  Gastrointestinal: Positive for abdominal pain.  Musculoskeletal: Positive for back pain.  All other systems reviewed and are negative.    Physical Exam Updated Vital Signs BP 113/78   Pulse 72   Temp 98.9 F (37.2 C) (Oral)   Resp 17   Wt 74.8 kg   LMP 05/01/2016   SpO2 99%   Physical Exam  Constitutional: She is oriented to person, place, and time. She appears well-developed and well-nourished.  Appears well, no vomiting  HENT:  Head: Normocephalic and atraumatic.  Mouth/Throat: Oropharynx is clear and moist.  Eyes: Conjunctivae and EOM are normal. Pupils are equal, round, and reactive to light.  Neck: Normal range of motion.  Cardiovascular: Normal rate, regular rhythm and normal heart sounds.   Pulmonary/Chest: Effort normal and breath sounds normal. No respiratory distress. She has no wheezes.  Reports pain of midsternal region, no focal tenderness, lungs clear  Abdominal: Soft. Bowel sounds are normal. There is no tenderness.  Abdomen soft, benign, no peritonitis  Musculoskeletal: Normal range of motion.  Neurological: She is alert and oriented to person, place, and time.  Skin: Skin is warm and dry.  Psychiatric: She has a normal mood and affect.  Nursing note and vitals reviewed.    ED Treatments / Results  Labs (all labs ordered  are listed, but only abnormal results are displayed) Labs Reviewed  I-STAT BETA HCG BLOOD, ED ([facility], [facility] ONLY) - Abnormal; Notable for the following:       Result Value   I-stat hCG, quantitative 6.0 (*)    All other components within normal limits  CBC WITH DIFFERENTIAL/PLATELET  COMPREHENSIVE METABOLIC PANEL    EKG  EKG Interpretation None       Radiology Ct Chest W Contrast  Result Date: 05/22/2016 CLINICAL DATA:  35 year old female with chest pain after EGD this morning. Was reportedly  diagnosed with mucosal tear "near the stomach" . Initial encounter. EXAM: CT CHEST WITH CONTRAST TECHNIQUE: Multidetector CT imaging of the chest was performed during intravenous contrast administration. CONTRAST:  75mL ISOVUE-300 IOPAMIDOL (ISOVUE-300) INJECTION 61% COMPARISON:  Esophagram 09/01/2011. FINDINGS: Cardiovascular: No pericardial effusion. Major mediastinal vascular structures appear normal. Negative visible aorta. Mediastinum/Nodes: No pneumomediastinum. No mediastinal hematoma. No lymphadenopathy. No focal esophageal abnormality identified. Lungs/Pleura: Major airways are patent. No pneumothorax. No pleural effusion. Negative lung parenchyma aside from mild dependent atelectasis. Upper Abdomen: The gastroesophageal junction appears normal. The visible stomach is unremarkable. No pneumoperitoneum. No upper abdominal free fluid. Negative visible liver, spleen, pancreas, adrenal glands, and kidneys. Musculoskeletal: No osseous abnormality identified. IMPRESSION: 1. Essentially negative CT appearance of the chest. 2. Negative CT appearance of the esophagus and No pleural effusion, mediastinal fluid, or gas to suggest an esophageal perforation. 3. The gastroesophageal junction and visible stomach appear normal. No pneumoperitoneum or upper abdominal free fluid. Electronically Signed   By: Odessa FlemingH  Hall M.D.   On: 05/22/2016 14:02    Procedures Procedures (including critical care time)  Medications Ordered in ED Medications  iopamidol (ISOVUE-300) 61 % injection (75 mLs  Contrast Given 05/22/16 1337)  0.9 %  sodium chloride infusion ( Intravenous Stopped 05/22/16 1454)  lidocaine (XYLOCAINE) 2 % viscous mouth solution 15 mL (15 mLs Mouth/Throat Given 05/22/16 1453)     Initial Impression / Assessment and Plan / ED Course  I have reviewed the triage vital signs and the nursing notes.  Pertinent labs & imaging results that were available during my care of the patient were reviewed by me and  considered in my medical decision making (see chart for details).  Clinical Course   35 year old female sent here for rule out of esophageal perforation post EGD and esophageal dilatation this morning. States upon waking from procedure she had severe pain in her midsternal region with ideation into her back. She has not had any vomiting. Her vitals are stable here. She is afebrile and nontoxic. Lab work is reassuring. CT scan without evidence of esophageal perforation. Patient tolerating fluids well here without difficulty.  Some relief of pain with viscous lidocaine. Will discharge home and have her follow-up with her GI doctor.  Recommended soft diet for the next few days.  Discussed plan with patient, she acknowledged understanding and agreed with plan of care.  Return precautions given for new or worsening symptoms.  Final Clinical Impressions(s) / ED Diagnoses   Final diagnoses:  Post procedure discomfort    New Prescriptions New Prescriptions   LIDOCAINE (XYLOCAINE) 2 % SOLUTION    Use as directed 15 mLs in the mouth or throat as needed for mouth pain.     Garlon HatchetLisa M Sanders, PA-C 05/22/16 1640    Doug SouSam Jacubowitz, MD 05/22/16 [phone]

## 2016-05-22 NOTE — ED Triage Notes (Signed)
Return from CT

## 2016-06-05 DIAGNOSIS — Z0000 Encounter for general adult medical examination without abnormal findings: Secondary | ICD-10-CM | POA: Diagnosis not present

## 2016-06-05 DIAGNOSIS — Z30011 Encounter for initial prescription of contraceptive pills: Secondary | ICD-10-CM | POA: Diagnosis not present

## 2016-06-05 DIAGNOSIS — Z136 Encounter for screening for cardiovascular disorders: Secondary | ICD-10-CM | POA: Diagnosis not present

## 2016-06-05 DIAGNOSIS — Z1283 Encounter for screening for malignant neoplasm of skin: Secondary | ICD-10-CM | POA: Diagnosis not present

## 2016-06-05 DIAGNOSIS — Z13220 Encounter for screening for lipoid disorders: Secondary | ICD-10-CM | POA: Diagnosis not present

## 2016-06-05 DIAGNOSIS — R319 Hematuria, unspecified: Secondary | ICD-10-CM | POA: Diagnosis not present

## 2016-08-01 ENCOUNTER — Other Ambulatory Visit (HOSPITAL_COMMUNITY)
Admission: RE | Admit: 2016-08-01 | Discharge: 2016-08-01 | Disposition: A | Discharge status: Home / Self Care | Payer: Federal, State, Local not specified - PPO | Source: Ambulatory Visit | Attending: Nurse Practitioner | Admitting: Nurse Practitioner

## 2016-08-01 ENCOUNTER — Other Ambulatory Visit: Payer: Self-pay | Admitting: Nurse Practitioner

## 2016-08-01 DIAGNOSIS — Z01419 Encounter for gynecological examination (general) (routine) without abnormal findings: Secondary | ICD-10-CM | POA: Insufficient documentation

## 2016-08-01 DIAGNOSIS — N910 Primary amenorrhea: Secondary | ICD-10-CM | POA: Diagnosis not present

## 2016-08-01 DIAGNOSIS — Z1151 Encounter for screening for human papillomavirus (HPV): Secondary | ICD-10-CM | POA: Diagnosis not present

## 2016-08-01 DIAGNOSIS — Z309 Encounter for contraceptive management, unspecified: Secondary | ICD-10-CM | POA: Diagnosis not present

## 2016-08-02 LAB — CYTOLOGY - PAP
DIAGNOSIS: NEGATIVE
HPV (WINDOPATH): NOT DETECTED

## 2016-08-15 DIAGNOSIS — N910 Primary amenorrhea: Secondary | ICD-10-CM | POA: Diagnosis not present

## 2016-09-23 DIAGNOSIS — K222 Esophageal obstruction: Secondary | ICD-10-CM | POA: Diagnosis not present

## 2016-09-23 DIAGNOSIS — R1310 Dysphagia, unspecified: Secondary | ICD-10-CM | POA: Diagnosis not present

## 2016-09-23 DIAGNOSIS — K219 Gastro-esophageal reflux disease without esophagitis: Secondary | ICD-10-CM | POA: Diagnosis not present

## 2016-11-21 DIAGNOSIS — Z3202 Encounter for pregnancy test, result negative: Secondary | ICD-10-CM | POA: Diagnosis not present

## 2016-11-21 DIAGNOSIS — Z30430 Encounter for insertion of intrauterine contraceptive device: Secondary | ICD-10-CM | POA: Diagnosis not present

## 2017-01-07 DIAGNOSIS — Z30431 Encounter for routine checking of intrauterine contraceptive device: Secondary | ICD-10-CM | POA: Diagnosis not present

## 2017-04-01 DIAGNOSIS — K219 Gastro-esophageal reflux disease without esophagitis: Secondary | ICD-10-CM | POA: Diagnosis not present

## 2017-04-01 DIAGNOSIS — R1310 Dysphagia, unspecified: Secondary | ICD-10-CM | POA: Diagnosis not present

## 2017-04-01 DIAGNOSIS — K222 Esophageal obstruction: Secondary | ICD-10-CM | POA: Diagnosis not present

## 2017-05-31 DIAGNOSIS — L03012 Cellulitis of left finger: Secondary | ICD-10-CM | POA: Diagnosis not present

## 2017-10-24 DIAGNOSIS — R5383 Other fatigue: Secondary | ICD-10-CM | POA: Diagnosis not present

## 2017-10-24 DIAGNOSIS — Z13228 Encounter for screening for other metabolic disorders: Secondary | ICD-10-CM | POA: Diagnosis not present

## 2017-10-24 DIAGNOSIS — Z0000 Encounter for general adult medical examination without abnormal findings: Secondary | ICD-10-CM | POA: Diagnosis not present

## 2017-10-24 DIAGNOSIS — R42 Dizziness and giddiness: Secondary | ICD-10-CM | POA: Diagnosis not present

## 2017-10-24 DIAGNOSIS — Z130 Encounter for screening for diseases of the blood and blood-forming organs and certain disorders involving the immune mechanism: Secondary | ICD-10-CM | POA: Diagnosis not present

## 2017-10-24 DIAGNOSIS — Z13220 Encounter for screening for lipoid disorders: Secondary | ICD-10-CM | POA: Diagnosis not present

## 2017-12-11 ENCOUNTER — Encounter (HOSPITAL_COMMUNITY): Payer: Self-pay | Admitting: Emergency Medicine

## 2017-12-11 ENCOUNTER — Other Ambulatory Visit: Payer: Self-pay

## 2017-12-11 ENCOUNTER — Emergency Department (HOSPITAL_COMMUNITY)
Admission: EM | Admit: 2017-12-11 | Discharge: 2017-12-11 | Disposition: A | Discharge status: Home / Self Care | Payer: Federal, State, Local not specified - PPO | Attending: Physician Assistant | Admitting: Physician Assistant

## 2017-12-11 DIAGNOSIS — R002 Palpitations: Secondary | ICD-10-CM | POA: Diagnosis not present

## 2017-12-11 DIAGNOSIS — R21 Rash and other nonspecific skin eruption: Secondary | ICD-10-CM | POA: Diagnosis not present

## 2017-12-11 DIAGNOSIS — J45909 Unspecified asthma, uncomplicated: Secondary | ICD-10-CM | POA: Insufficient documentation

## 2017-12-11 MED ORDER — CLOTRIMAZOLE 1 % EX CREA: TOPICAL_CREAM | CUTANEOUS | 0 refills | Status: DC

## 2017-12-11 NOTE — ED Notes (Signed)
Red splotchy spots under left arm

## 2017-12-11 NOTE — ED Triage Notes (Signed)
Patient complains a fatigue, body aches, and painful red rash in left axilla after receiving immunizations in anticipation for a trip to [place]. Denies shortness of breath.

## 2017-12-11 NOTE — ED Provider Notes (Signed)
[facility] EMERGENCY DEPARTMENT Provider Note   CSN: [id number] Arrival date & time: 12/11/17  0756     History   Chief Complaint Chief Complaint  Patient presents with  . Allergic Reaction    HPI Sheila Graham is a 37 y.o. female who presents for evaluation of rash to left axilla that began 2 days ago.  Patient also reports some generalized soreness, myalgias, throat scratching sensation.  Patient reports that symptoms began after she got vaccines for an upcoming trip to [place] 4 days ago.  Patient reports she got a tetanus, managed coccidial vaccine and MMR vaccine.  Patient reports that she felt some fluttering sensation in her chest but denied any actual pain.  Patient reports that yesterday she started having some red splotches across her chest.  She states that those eventually resolved but that there is one under her left axilla that has remained.  She states it is a burning pain.  She has not noticed any vesicles or drainage from the areas.  Patient reports she has not taken any medications.  Patient denies any other new medications, detergents, soaps, lotions.  Patient states she has been tolerating her secretions and p.o. without any difficulty.  Patient denies any tongue or lip swelling, difficulty breathing, vomiting.  The history is provided by the patient.    Past Medical History:  Diagnosis Date  . Asthma   . Esophageal web     There are no active problems to display for this patient.   Past Surgical History:  Procedure Laterality Date  . ESOPHAGOGASTRODUODENOSCOPY N/A 04/07/2016   Procedure: ESOPHAGOGASTRODUODENOSCOPY (EGD);  Surgeon: Otis Brace, MD;  Location: [facility];  Service: Gastroenterology;  Laterality: N/A;     OB History   None      Home Medications    Prior to Admission medications   Medication Sig Start Date End Date Taking? Authorizing Provider  clotrimazole (LOTRIMIN) 1 % cream Apply to affected area 2 times  daily 12/11/17   Providence Lanius A, PA-C  lidocaine (XYLOCAINE) 2 % solution Use as directed 15 mLs in the mouth or throat as needed for mouth pain. 05/22/16   Larene Pickett, PA-C  pantoprazole (PROTONIX) 40 MG tablet Take 1 tablet (40 mg total) by mouth 2 (two) times daily. Patient taking differently: Take 40 mg by mouth daily.  04/08/16   Everlene Balls, MD    Family History No family history on file.  Social History Social History   Tobacco Use  . Smoking status: Never Smoker  . Smokeless tobacco: Never Used  Substance Use Topics  . Alcohol use: Yes  . Drug use: Not on file     Allergies   Patient has no known allergies.   Review of Systems Review of Systems  Constitutional: Negative for fever.  Respiratory: Negative for cough and shortness of breath.   Cardiovascular: Positive for palpitations. Negative for chest pain.  Gastrointestinal: Negative for abdominal pain, nausea and vomiting.  Genitourinary: Negative for dysuria and hematuria.  Musculoskeletal: Positive for myalgias.  Skin: Positive for rash.  Neurological: Negative for headaches.  All other systems reviewed and are negative.    Physical Exam Updated Vital Signs BP 96/69   Pulse (!) 55   Temp 99.6 F (37.6 C) (Oral)   Resp 20   SpO2 100%   Physical Exam  Constitutional: She is oriented to person, place, and time. She appears well-developed and well-nourished.  HENT:  Head: Normocephalic and atraumatic.  Mouth/Throat:  Oropharynx is clear and moist and mucous membranes are normal.  Airway is patent, phonation is intact.  No stridor.  Posterior oropharynx is clear.  No oral angioedema.  No oral lesions noted.  Eyes: Pupils are equal, round, and reactive to light. Conjunctivae, EOM and lids are normal. Right eye exhibits no discharge. Left eye exhibits no discharge. No scleral icterus.  Neck: Full passive range of motion without pain.  Cardiovascular: Normal rate, regular rhythm, normal heart sounds and  normal pulses. Exam reveals no gallop and no friction rub.  No murmur heard. Pulmonary/Chest: Effort normal and breath sounds normal.  Lungs clear to auscultation bilaterally.  Symmetric chest rise.  No wheezing, rales, rhonchi.  Able to speak in full sentences without any difficulty.  Abdominal: Soft. Normal appearance. There is no tenderness. There is no rigidity and no guarding.  Musculoskeletal: Normal range of motion.  Neurological: She is alert and oriented to person, place, and time.  Skin: Skin is warm and dry. Capillary refill takes less than 2 seconds.  Left axilla is with a 2 x 3 cm area of erythematous, dry scaly patch.  No fluctuance, warmth, induration.  No rash noted on palms or soles of feet.  Psychiatric: She has a normal mood and affect. Her speech is normal and behavior is normal.  Nursing note and vitals reviewed.       ED Treatments / Results  Labs (all labs ordered are listed, but only abnormal results are displayed) Labs Reviewed - No data to display  EKG EKG Interpretation  Date/Time:  Thursday Dec 11 2017 10:07:29 EDT Ventricular Rate:  58 PR Interval:    QRS Duration: 101 QT Interval:  408 QTC Calculation: 401 R Axis:   75 Text Interpretation:  Sinus rhythm Borderline short PR interval Normal sinus rhythm Confirmed by Thomasene Lot, [place] [phone]) on 12/11/2017 10:25:25 AM   Radiology No results found.  Procedures Procedures (including critical care time)  Medications Ordered in ED Medications - No data to display   Initial Impression / Assessment and Plan / ED Course  I have reviewed the triage vital signs and the nursing notes.  Pertinent labs & imaging results that were available during my care of the patient were reviewed by me and considered in my medical decision making (see chart for details).     37 year old female who presents for evaluation of rash to the left axilla that began 2 days ago.  Also reports having some generalized  myalgias, rotations.  Symptoms began after she received vaccines for for an upcoming [place] trip 4 days ago.  Denies any throat swelling, tongue or lip swelling, difficulty tolerating secretions, vomiting, difficulty breathing.  No new exposures, medicines.  States she initially had some red spots on her anterior chest but states that they improved.  Now the one on her left axilla is only when that is left. Patient is afebrile, non-toxic appearing, sitting comfortably on examination table. Vital signs reviewed and stable.  On exam, patient with a 2 x 3 7 m area of erythema and dry scaly skin noted to the left axilla.  No underlying fluctuance, warmth, induration.  No rash noted on palms or soles.  No oral lesions.  History/physical exam is not concerning for SJS, TENS.  No signs of surrounding infection that would indicate cellulitis.  Will plan to treat with supportive care measures and antifungal cream.  Patient instructed to follow-up with her primary care doctor in 2 to 4 days for further evaluation.  EKG  reviewed.  Normal sinus rhythm.  Discussed return precautions with patient. Patient had ample opportunity for questions and discussion. All patient's questions were answered with full understanding. Strict return precautions discussed. Patient expresses understanding and agreement to plan.    Final Clinical Impressions(s) / ED Diagnoses   Final diagnoses:  Rash    ED Discharge Orders        Ordered    clotrimazole (LOTRIMIN) 1 % cream     12/11/17 1013       Volanda Napoleon, PA-C 12/11/17 1032    Mackuen, Fredia Sorrow, MD 12/12/17 0840

## 2017-12-11 NOTE — Discharge Instructions (Signed)
You can take Tylenol or Ibuprofen as directed for pain. You can alternate Tylenol and Ibuprofen every 4 hours. If you take Tylenol at 1pm, then you can take Ibuprofen at 5pm. Then you can take Tylenol again at 9pm.   He will take Benadryl for symptomatic relief.  He can apply the omeprazole cream as directed.  Follow-up with your primary care doctor in the next 2 to 4 days for further evaluation.  Return to emergency department for any worsening rash, breathing, swelling of your lips or tongue, vomiting, fever, chest pain or any other worsening or concerning symptoms.

## 2018-03-18 DIAGNOSIS — B360 Pityriasis versicolor: Secondary | ICD-10-CM | POA: Diagnosis not present

## 2018-03-18 DIAGNOSIS — R481 Agnosia: Secondary | ICD-10-CM | POA: Diagnosis not present

## 2018-03-31 DIAGNOSIS — Z01419 Encounter for gynecological examination (general) (routine) without abnormal findings: Secondary | ICD-10-CM | POA: Diagnosis not present

## 2018-05-05 DIAGNOSIS — K219 Gastro-esophageal reflux disease without esophagitis: Secondary | ICD-10-CM | POA: Diagnosis not present

## 2018-05-05 DIAGNOSIS — R1310 Dysphagia, unspecified: Secondary | ICD-10-CM | POA: Diagnosis not present

## 2018-05-05 DIAGNOSIS — K222 Esophageal obstruction: Secondary | ICD-10-CM | POA: Diagnosis not present

## 2018-05-28 ENCOUNTER — Emergency Department (HOSPITAL_COMMUNITY)
Admission: EM | Admit: 2018-05-28 | Discharge: 2018-05-28 | Disposition: A | Discharge status: Home / Self Care | Payer: Federal, State, Local not specified - PPO | Attending: Emergency Medicine | Admitting: Emergency Medicine

## 2018-05-28 ENCOUNTER — Encounter (HOSPITAL_COMMUNITY): Payer: Self-pay

## 2018-05-28 ENCOUNTER — Emergency Department (HOSPITAL_COMMUNITY): Payer: Federal, State, Local not specified - PPO

## 2018-05-28 DIAGNOSIS — K429 Umbilical hernia without obstruction or gangrene: Secondary | ICD-10-CM | POA: Diagnosis not present

## 2018-05-28 DIAGNOSIS — R1033 Periumbilical pain: Secondary | ICD-10-CM

## 2018-05-28 DIAGNOSIS — J45909 Unspecified asthma, uncomplicated: Secondary | ICD-10-CM | POA: Insufficient documentation

## 2018-05-28 DIAGNOSIS — Z79899 Other long term (current) drug therapy: Secondary | ICD-10-CM | POA: Insufficient documentation

## 2018-05-28 LAB — I-STAT BETA HCG BLOOD, ED (MC, WL, AP ONLY): I-stat hCG, quantitative: <5 m[IU]/mL (ref <5)

## 2018-05-28 LAB — COMPREHENSIVE METABOLIC PANEL
ALBUMIN: 4.3 g/dL (ref 3.5–5.0)
ALT: 15 U/L (ref 0–44)
ANION GAP: 10 (ref 5–15)
AST: 18 U/L (ref 15–41)
Alkaline Phosphatase: 47 U/L (ref 38–126)
BILIRUBIN TOTAL: 1.1 mg/dL (ref 0.3–1.2)
BUN: 10 mg/dL (ref 6–20)
CHLORIDE: 105 mmol/L (ref 98–111)
CO2: 22 mmol/L (ref 22–32)
Calcium: 9.3 mg/dL (ref 8.9–10.3)
Creatinine, Ser: 0.76 mg/dL (ref 0.44–1.00)
GFR calc Af Amer: >60 mL/min (ref >60)
GFR calc non Af Amer: >60 mL/min (ref >60)
GLUCOSE: 92 mg/dL (ref 70–99)
POTASSIUM: 3.4 mmol/L — AB (ref 3.5–5.1)
SODIUM: 137 mmol/L (ref 135–145)
TOTAL PROTEIN: 7.6 g/dL (ref 6.5–8.1)

## 2018-05-28 LAB — LIPASE, BLOOD: LIPASE: 31 U/L (ref 11–51)

## 2018-05-28 LAB — URINALYSIS, ROUTINE W REFLEX MICROSCOPIC
Bilirubin Urine: NEGATIVE
GLUCOSE, UA: NEGATIVE mg/dL
KETONES UR: 5 mg/dL — AB
Leukocytes, UA: NEGATIVE
NITRITE: NEGATIVE
PROTEIN: NEGATIVE mg/dL
Specific Gravity, Urine: 1.005 (ref 1.005–1.030)
pH: 5 (ref 5.0–8.0)

## 2018-05-28 LAB — WET PREP, GENITAL
Clue Cells Wet Prep HPF POC: NONE SEEN
SPERM: NONE SEEN
Trich, Wet Prep: NONE SEEN
Yeast Wet Prep HPF POC: NONE SEEN

## 2018-05-28 LAB — CBC
HEMATOCRIT: 44.6 % (ref 36.0–46.0)
HEMOGLOBIN: 14.4 g/dL (ref 12.0–15.0)
MCH: 29.6 pg (ref 26.0–34.0)
MCHC: 32.3 g/dL (ref 30.0–36.0)
MCV: 91.6 fL (ref 80.0–100.0)
Platelets: 344 10*3/uL (ref 150–400)
RBC: 4.87 MIL/uL (ref 3.87–5.11)
RDW: 11.9 % (ref 11.5–15.5)
WBC: 10.5 10*3/uL (ref 4.0–10.5)
nRBC: 0 % (ref 0.0–0.2)

## 2018-05-28 MED ORDER — ONDANSETRON HCL 4 MG/2ML IJ SOLN
4.0000 mg | Freq: Once | INTRAMUSCULAR | Status: AC
  Administered 2018-05-28: 4 mg via INTRAVENOUS
  Filled 2018-05-28: qty 2

## 2018-05-28 MED ORDER — IOHEXOL 300 MG/ML  SOLN
100.0000 mL | Freq: Once | INTRAMUSCULAR | Status: AC | PRN
  Administered 2018-05-28: 100 mL via INTRAVENOUS

## 2018-05-28 MED ORDER — NAPROXEN 500 MG PO TABS: 500.0000 mg | ORAL_TABLET | Freq: Two times a day (BID) | ORAL | 0 refills | Status: DC

## 2018-05-28 MED ORDER — METHOCARBAMOL 500 MG PO TABS
500.0000 mg | ORAL_TABLET | Freq: Once | ORAL | Status: AC
Start: 2018-05-28 — End: 2018-05-28
  Administered 2018-05-28: 500 mg via ORAL
  Filled 2018-05-28: qty 1

## 2018-05-28 MED ORDER — SODIUM CHLORIDE 0.9 % IV BOLUS
1000.0000 mL | Freq: Once | INTRAVENOUS | Status: AC
  Administered 2018-05-28: 1000 mL via INTRAVENOUS

## 2018-05-28 MED ORDER — METHOCARBAMOL 500 MG PO TABS: 500.0000 mg | ORAL_TABLET | Freq: Three times a day (TID) | ORAL | 0 refills | Status: DC | PRN

## 2018-05-28 MED ORDER — MORPHINE SULFATE (PF) 4 MG/ML IV SOLN
4.0000 mg | Freq: Once | INTRAVENOUS | Status: AC
  Administered 2018-05-28: 4 mg via INTRAVENOUS
  Filled 2018-05-28: qty 1

## 2018-05-28 MED ORDER — KETOROLAC TROMETHAMINE 15 MG/ML IJ SOLN
15.0000 mg | Freq: Once | INTRAMUSCULAR | Status: AC
  Administered 2018-05-28: 15 mg via INTRAVENOUS
  Filled 2018-05-28: qty 1

## 2018-05-28 NOTE — ED Notes (Signed)
Pt given ginger ale.

## 2018-05-28 NOTE — ED Notes (Signed)
Pt back from CT

## 2018-05-28 NOTE — Discharge Instructions (Addendum)
You are seen in the emergency department today for abdominal pain.  Your overall work-up in the ER was reassuring.  Your urine did show some blood in it, this is something that should be rechecked by primary care provider within the next 1 to 2 weeks.  If you do not have a primary care provider please call the phone number circled in your discharge instructions.  Your CT scan did show that you have a small umbilical hernia, we suspect this is causing the discomfort that you are having.  We are sending you home with the following medicines to help with this pain:  - Naproxen is a nonsteroidal anti-inflammatory medication that will help with pain and swelling. Be sure to take this medication as prescribed with food, 1 pill every 12 hours,  It should be taken with food, as it can cause stomach upset, and more seriously, stomach bleeding. Do not take other nonsteroidal anti-inflammatory medications with this such as Advil, Motrin, Aleve, Mobic, Goodie Powder, or Motrin.    - Robaxin is the muscle relaxer I have prescribed, this is meant to help with muscle tightness. Be aware that this medication may make you drowsy therefore the first time you take this it should be at a time you are in an environment where you can rest. Do not drive or operate heavy machinery when taking this medication. Do not drink alcohol or take other sedating medications with this medicine such as narcotics or benzodiazepines.   You make take Tylenol per over the counter dosing with these medications.   We have prescribed you new medication(s) today. Discuss the medications prescribed today with your pharmacist as they can have adverse effects and interactions with your other medicines including over the counter and prescribed medications. Seek medical evaluation if you start to experience new or abnormal symptoms after taking one of these medicines, seek care immediately if you start to experience difficulty breathing, feeling of your  throat closing, facial swelling, or rash as these could be indications of a more serious allergic reaction    We would like you to follow-up with the general surgery office providing her discharge instructions within the next 1 week.  In the interim please take it easy and avoid heavy lifting or excessive physical activity.  Return to the ER for new or worsening symptoms including but not limited to worsening pain, fever, inability to keep fluids down, not having a bowel movement within 48 hours, or any other concerns that you may have.

## 2018-05-28 NOTE — ED Notes (Signed)
ED Provider at bedside. 

## 2018-05-28 NOTE — ED Triage Notes (Signed)
Pt presents with onset of umbilical pain that began when she awoke today.  Pt reports pain has worsened to a stabbing pain to the L of umbilicus and to suprapubic area.  Pt reports nausea and "a little" dysuria; denies any vaginal discharge, denies any diarrhea; last bowel movement was yesterday; denies previous abdominal surgeries.

## 2018-05-28 NOTE — ED Provider Notes (Signed)
[facility] EMERGENCY DEPARTMENT Provider Note   CSN: [id number] Arrival date & time: 05/28/18  1252     History   Chief Complaint Chief Complaint  Patient presents with  . Abdominal Pain    HPI Sheila Graham is a 37 y.o. female with a hx of asthma and esophageal web who presents to the ED with complaints of abdominal pain that stared this AM. Patient states pain is located in the periumbilical area with some radiation to the lower abdomen. She states pain is constant and achy with waves of stabbing sensation to same area. Pain currently is a 7/10 in severity it is worse with a lot of movement and when attempting to urinate, alleviated in some positions. Reports associated nausea without emesis. No interventions PTA. Denies fever, chills, emesis, diarrhea, blood in stool, constipation, dysuria, urgency, frequency, or hematuria. Denies vaginal bleeding/vaginal discharge. She states LMP was the beginning of month, no concern for STDs. Denies hx of prior abdominal surgeries. Denies suspicious food intake.   HPI  Past Medical History:  Diagnosis Date  . Asthma   . Esophageal web     There are no active problems to display for this patient.   Past Surgical History:  Procedure Laterality Date  . ESOPHAGOGASTRODUODENOSCOPY N/A 04/07/2016   Procedure: ESOPHAGOGASTRODUODENOSCOPY (EGD);  Surgeon: Kathi Der, MD;  Location: [facility] ENDOSCOPY;  Service: Gastroenterology;  Laterality: N/A;     OB History   None      Home Medications    Prior to Admission medications   Medication Sig Start Date End Date Taking? Authorizing Provider  clotrimazole (LOTRIMIN) 1 % cream Apply to affected area 2 times daily 12/11/17   Graciella Freer A, PA-C  lidocaine (XYLOCAINE) 2 % solution Use as directed 15 mLs in the mouth or throat as needed for mouth pain. 05/22/16   Garlon Hatchet, PA-C  pantoprazole (PROTONIX) 40 MG tablet Take 1 tablet (40 mg total) by mouth 2 (two) times  daily. Patient taking differently: Take 40 mg by mouth daily.  04/08/16   Tomasita Crumble, MD    Family History History reviewed. No pertinent family history.  Social History Social History   Tobacco Use  . Smoking status: Never Smoker  . Smokeless tobacco: Never Used  Substance Use Topics  . Alcohol use: Yes  . Drug use: Not on file     Allergies   Patient has no known allergies.   Review of Systems Review of Systems  Constitutional: Negative for chills and fever.  Respiratory: Negative for shortness of breath.   Cardiovascular: Negative for chest pain.  Gastrointestinal: Positive for abdominal pain and nausea. Negative for anal bleeding, blood in stool, constipation, diarrhea and vomiting.  Genitourinary: Negative for dysuria, frequency, urgency, vaginal bleeding and vaginal discharge.  All other systems reviewed and are negative.    Physical Exam Updated Vital Signs BP 119/82 (BP Location: Right Arm)   Pulse 63   Temp 98.4 F (36.9 C) (Oral)   Resp 18   Ht 5\' 5"  (1.651 m)   Wt 72.6 kg   LMP 05/07/2018 (Approximate)   SpO2 100%   BMI 26.63 kg/m   Physical Exam  Constitutional: She appears well-developed and well-nourished.  Non-toxic appearance. No distress.  HENT:  Head: Normocephalic and atraumatic.  Eyes: Conjunctivae are normal. Right eye exhibits no discharge. Left eye exhibits no discharge.  Neck: Neck supple.  Cardiovascular: Normal rate and regular rhythm.  Pulmonary/Chest: Effort normal and breath sounds normal. No  respiratory distress. She has no wheezes. She has no rhonchi. She has no rales.  Respiration even and unlabored  Abdominal: Soft. She exhibits no distension. There is tenderness (most prominent to periumbilical) in the right lower quadrant, periumbilical area, suprapubic area and left lower quadrant. There is no rigidity, no rebound, no guarding and no CVA tenderness.  Small palpable defect in the umbilical area suspicious for hernia,  reducible without overlying skin changes.   Genitourinary: Pelvic exam was performed with patient supine. There is no rash or tenderness on the right labia. There is no rash or tenderness on the left labia. Cervix exhibits no motion tenderness and no friability. Right adnexum displays no mass, no tenderness and no fullness. Left adnexum displays no mass, no tenderness and no fullness. No erythema, tenderness or bleeding in the vagina. Vaginal discharge (mostly clear, minimal) found.  Genitourinary Comments: EDT Vernona Rieger present as chaperone.   Neurological: She is alert.  Clear speech.   Skin: Skin is warm and dry. No rash noted.  Psychiatric: She has a normal mood and affect. Her behavior is normal.  Nursing note and vitals reviewed.    ED Treatments / Results  Labs (all labs ordered are listed, but only abnormal results are displayed) Labs Reviewed  WET PREP, GENITAL - Abnormal; Notable for the following components:      Result Value   WBC, Wet Prep HPF POC MODERATE (*)    All other components within normal limits  COMPREHENSIVE METABOLIC PANEL - Abnormal; Notable for the following components:   Potassium 3.4 (*)    All other components within normal limits  URINALYSIS, ROUTINE W REFLEX MICROSCOPIC - Abnormal; Notable for the following components:   Color, Urine STRAW (*)    Hgb urine dipstick MODERATE (*)    Ketones, ur 5 (*)    Bacteria, UA RARE (*)    All other components within normal limits  LIPASE, BLOOD  CBC  I-STAT BETA HCG BLOOD, ED ([facility], [facility], [facility] ONLY)  GC/CHLAMYDIA PROBE AMP ([place]) NOT AT [facility]    EKG None  Radiology Ct Abdomen Pelvis W Contrast  Result Date: 05/28/2018 CLINICAL DATA:  37 year old female with acute abdominal and pelvic pain today. EXAM: CT ABDOMEN AND PELVIS WITH CONTRAST TECHNIQUE: Multidetector CT imaging of the abdomen and pelvis was performed using the standard protocol following bolus administration of intravenous contrast. CONTRAST:   OMNIPAQUE IOHEXOL 300 MG/ML  SOLN COMPARISON:  None. FINDINGS: Lower chest: Unremarkable Hepatobiliary: The liver and gallbladder are unremarkable. No biliary dilatation. Pancreas: Unremarkable Spleen: Unremarkable Adrenals/Urinary Tract: The kidneys, adrenal glands and bladder are unremarkable except for a punctate nonobstructing LEFT renal calculus and cyst. Stomach/Bowel: Stomach is within normal limits. Appendix appears normal. No evidence of bowel wall thickening, distention, or inflammatory changes. Vascular/Lymphatic: No significant vascular findings are present. No enlarged abdominal or pelvic lymph nodes. Reproductive: An IUD is present within a retroverted uterus. No adnexal abnormalities. Other: A trace amount of free pelvic fluid may be physiologic. A tiny umbilical hernia containing fat is noted without adjacent inflammation. No focal collection, pneumoperitoneum. Musculoskeletal: No acute or significant osseous findings. IMPRESSION: 1. Trace amount of free pelvic fluid which may be physiologic. 2. Tiny umbilical hernia containing fat without adjacent inflammation. 3. No other significant abnormalities.  Normal appendix. Electronically Signed   By: Harmon Pier M.D.   On: 05/28/2018 16:37    Procedures Procedures (including critical care time)  Medications Ordered in ED Medications  methocarbamol (ROBAXIN) tablet 500 mg (  has no administration in time range)  sodium chloride 0.9 % bolus 1,000 mL (0 mLs Intravenous Stopped 05/28/18 1739)  ondansetron (ZOFRAN) injection 4 mg (4 mg Intravenous Given 05/28/18 1433)  morphine 4 MG/ML injection 4 mg (4 mg Intravenous Given 05/28/18 1434)  iohexol (OMNIPAQUE) 300 MG/ML solution 100 mL (100 mLs Intravenous Contrast Given 05/28/18 1620)  ketorolac (TORADOL) 15 MG/ML injection 15 mg (15 mg Intravenous Given 05/28/18 1739)     Initial Impression / Assessment and Plan / ED Course  I have reviewed the triage vital signs and the nursing  notes.  Pertinent labs & imaging results that were available during my care of the patient were reviewed by me and considered in my medical decision making (see chart for details).    Patient presents to the ED with complaints of abdominal pain. Patient nontoxic appearing, in no apparent distress, vitals WN . On exam patient tender to periumbilical and diffuse lower abdomen, no peritoneal signs. She does have a palpable umbilical hernia without obvious strangulation. Pelvic exam benign.  Will evaluate with labs and CT abdomen/pelvis given tenderness does not seem isolated specifically to hernia and is to lower abdomen as well  Analgesics, anti-emetics, and fluids administered.   Labs reviewed and grossly unremarkable. No leukocytosis, no anemia, no significant electrolyte derangements- mild hypokalemia at 3.4. LFTs, renal function, and lipase WNL. Urinalysis without obvious infection, there is moderate hgb which will require PCP recheck. Wet prep without trich/BV/yeast- given monogamous sexual activity w/ husband and benign pelvic, doubt PID. Preg test negative- doubt ectopic.   Imaging with amount of free pelvic fluid which may be physiologic and tiny umbilical hernia containing fat without adjacent inflammation. No other significant abnormalities.  Normal appendix. At this time suspect that hernia may be cause of patient's discomfort. Doubt cholecystitis, pancreatitis, diverticulitis, appendicitis, or bowel obstruction/perforation/strangulation. Patient tolerating PO in the emergency department feeling better and ready to go home. Will discharge home with supportive measures. I discussed results, treatment plan, need for general surgery follow-up, and return precautions with the patient. Provided opportunity for questions, patient confirmed understanding and is in agreement with plan.    Final Clinical Impressions(s) / ED Diagnoses   Final diagnoses:  Periumbilical abdominal pain  Umbilical hernia  without obstruction and without gangrene    ED Discharge Orders         Ordered    naproxen (NAPROSYN) 500 MG tablet  2 times daily     05/28/18 1832    methocarbamol (ROBAXIN) 500 MG tablet  Every 8 hours PRN     05/28/18 1832           Cherly Anderson, PA-C 05/28/18 1836    Tegeler, Canary Brim, MD 05/29/18 [phone]

## 2018-05-29 LAB — GC/CHLAMYDIA PROBE AMP (~~LOC~~) NOT AT ARMC
CHLAMYDIA, DNA PROBE: NEGATIVE
Neisseria Gonorrhea: NEGATIVE

## 2018-06-10 DIAGNOSIS — K429 Umbilical hernia without obstruction or gangrene: Secondary | ICD-10-CM | POA: Diagnosis not present

## 2018-06-17 DIAGNOSIS — K429 Umbilical hernia without obstruction or gangrene: Secondary | ICD-10-CM | POA: Diagnosis not present

## 2018-07-20 DIAGNOSIS — W19XXXA Unspecified fall, initial encounter: Secondary | ICD-10-CM | POA: Diagnosis not present

## 2018-07-20 DIAGNOSIS — M549 Dorsalgia, unspecified: Secondary | ICD-10-CM | POA: Diagnosis not present

## 2018-07-20 DIAGNOSIS — M7918 Myalgia, other site: Secondary | ICD-10-CM | POA: Diagnosis not present

## 2018-07-20 DIAGNOSIS — M545 Low back pain: Secondary | ICD-10-CM | POA: Diagnosis not present

## 2018-07-28 DIAGNOSIS — M533 Sacrococcygeal disorders, not elsewhere classified: Secondary | ICD-10-CM | POA: Diagnosis not present

## 2018-07-28 DIAGNOSIS — S300XXA Contusion of lower back and pelvis, initial encounter: Secondary | ICD-10-CM | POA: Diagnosis not present

## 2018-12-10 DIAGNOSIS — Z8719 Personal history of other diseases of the digestive system: Secondary | ICD-10-CM | POA: Diagnosis not present

## 2018-12-10 DIAGNOSIS — Z9889 Other specified postprocedural states: Secondary | ICD-10-CM | POA: Diagnosis not present

## 2018-12-10 DIAGNOSIS — R1084 Generalized abdominal pain: Secondary | ICD-10-CM | POA: Diagnosis not present

## 2018-12-11 ENCOUNTER — Other Ambulatory Visit: Payer: Self-pay | Admitting: General Surgery

## 2018-12-14 ENCOUNTER — Other Ambulatory Visit: Payer: Self-pay | Admitting: General Surgery

## 2018-12-14 DIAGNOSIS — Z9889 Other specified postprocedural states: Secondary | ICD-10-CM

## 2018-12-14 DIAGNOSIS — Z8719 Personal history of other diseases of the digestive system: Secondary | ICD-10-CM

## 2018-12-15 ENCOUNTER — Other Ambulatory Visit: Payer: Self-pay | Admitting: General Surgery

## 2018-12-15 DIAGNOSIS — Z9889 Other specified postprocedural states: Secondary | ICD-10-CM

## 2018-12-15 DIAGNOSIS — Z8719 Personal history of other diseases of the digestive system: Secondary | ICD-10-CM

## 2018-12-17 ENCOUNTER — Ambulatory Visit
Admission: RE | Admit: 2018-12-17 | Discharge: 2018-12-17 | Disposition: A | Discharge status: Home / Self Care | Payer: Federal, State, Local not specified - PPO | Source: Ambulatory Visit | Attending: General Surgery | Admitting: General Surgery

## 2018-12-17 DIAGNOSIS — Z8719 Personal history of other diseases of the digestive system: Secondary | ICD-10-CM

## 2018-12-17 DIAGNOSIS — Z9889 Other specified postprocedural states: Secondary | ICD-10-CM

## 2019-01-31 ENCOUNTER — Emergency Department (HOSPITAL_COMMUNITY): Payer: Federal, State, Local not specified - PPO

## 2019-01-31 ENCOUNTER — Encounter (HOSPITAL_COMMUNITY): Payer: Self-pay | Admitting: Emergency Medicine

## 2019-01-31 ENCOUNTER — Other Ambulatory Visit: Payer: Self-pay

## 2019-01-31 ENCOUNTER — Emergency Department (HOSPITAL_COMMUNITY)
Admission: EM | Admit: 2019-01-31 | Discharge: 2019-01-31 | Disposition: A | Discharge status: Home / Self Care | Payer: Federal, State, Local not specified - PPO | Attending: Emergency Medicine | Admitting: Emergency Medicine

## 2019-01-31 DIAGNOSIS — Z79899 Other long term (current) drug therapy: Secondary | ICD-10-CM | POA: Diagnosis not present

## 2019-01-31 DIAGNOSIS — R110 Nausea: Secondary | ICD-10-CM | POA: Diagnosis not present

## 2019-01-31 DIAGNOSIS — S14109A Unspecified injury at unspecified level of cervical spinal cord, initial encounter: Secondary | ICD-10-CM | POA: Diagnosis not present

## 2019-01-31 DIAGNOSIS — Y999 Unspecified external cause status: Secondary | ICD-10-CM | POA: Diagnosis not present

## 2019-01-31 DIAGNOSIS — R42 Dizziness and giddiness: Secondary | ICD-10-CM | POA: Diagnosis not present

## 2019-01-31 DIAGNOSIS — S060X1A Concussion with loss of consciousness of 30 minutes or less, initial encounter: Secondary | ICD-10-CM | POA: Diagnosis not present

## 2019-01-31 DIAGNOSIS — Y9389 Activity, other specified: Secondary | ICD-10-CM | POA: Insufficient documentation

## 2019-01-31 DIAGNOSIS — M542 Cervicalgia: Secondary | ICD-10-CM | POA: Diagnosis not present

## 2019-01-31 DIAGNOSIS — W19XXXA Unspecified fall, initial encounter: Secondary | ICD-10-CM | POA: Insufficient documentation

## 2019-01-31 DIAGNOSIS — Y92828 Other wilderness area as the place of occurrence of the external cause: Secondary | ICD-10-CM | POA: Insufficient documentation

## 2019-01-31 DIAGNOSIS — J45909 Unspecified asthma, uncomplicated: Secondary | ICD-10-CM | POA: Insufficient documentation

## 2019-01-31 DIAGNOSIS — R51 Headache: Secondary | ICD-10-CM | POA: Diagnosis not present

## 2019-01-31 DIAGNOSIS — S0990XA Unspecified injury of head, initial encounter: Secondary | ICD-10-CM | POA: Diagnosis not present

## 2019-01-31 MED ORDER — ACETAMINOPHEN 500 MG PO TABS
1000.0000 mg | ORAL_TABLET | Freq: Once | ORAL | Status: AC
  Administered 2019-01-31: 1000 mg via ORAL
  Filled 2019-01-31: qty 2

## 2019-01-31 MED ORDER — ONDANSETRON 4 MG PO TBDP
4.0000 mg | ORAL_TABLET | Freq: Once | ORAL | Status: AC
  Administered 2019-01-31: 4 mg via ORAL
  Filled 2019-01-31: qty 1

## 2019-01-31 MED ORDER — ONDANSETRON 4 MG PO TBDP: 4.0000 mg | ORAL_TABLET | Freq: Four times a day (QID) | ORAL | 0 refills | Status: DC | PRN

## 2019-01-31 NOTE — ED Triage Notes (Addendum)
Pt arrives by car w/ co a fall. Fell back and hit her head [place]. Pt doesn't remember falling and LOC for less than 1 minute. Pt is nauseated. Pt states she forget some things. Pt currently A&O x4. Denies being on blood thinners.

## 2019-01-31 NOTE — ED Triage Notes (Signed)
Patient fell and hit head while walking next to shallow river about 1.5 hours ago. Abrasion to posterior scalp, bleeding controlled. Patient's husband states she lost consciousness for less than a minute. She endorses confusion, nausea, dizziness. Unable to recall actual event occurring.

## 2019-01-31 NOTE — Discharge Instructions (Addendum)
Please try tylenol.  If tylenol does not help then you may add in ibuprofen.   Please take Ibuprofen (Advil, motrin) and Tylenol (acetaminophen) to relieve your pain.  You may take up to 600 MG (3 pills) of normal strength ibuprofen every 8 hours as needed.  In between doses of ibuprofen you make take tylenol, up to 1,000 mg (two extra strength pills).  Do not take more than 3,000 mg tylenol in a 24 hour period.  Please check all medication labels as many medications such as pain and cold medications may contain tylenol.  Do not drink alcohol while taking these medications.  Do not take other NSAID'S while taking ibuprofen (such as aleve or naproxen).  Please take ibuprofen with food to decrease stomach upset.

## 2019-01-31 NOTE — ED Provider Notes (Signed)
[facility] Provider Note   CSN: [id number] Arrival date & time: 01/31/19  1416    History   Chief Complaint Chief Complaint  Patient presents with  . Fall    HPI Sheila Graham is a 38 y.o. female with a past medical history of asthma who presents today for evaluation after a fall.  She was tubing on a river with her husband and children.  She reports that she had been well throughout the day.  Husband reports that he had been talking to her and then approximately 10 to 15 seconds after he had walked away to see their child someone alerted him that she had fallen.  She was unconscious for "a few seconds."  This happened approximately 1.5 hours prior to arrival.  She reports that she does not remember falling or striking her head.  The report given by bystanders to patient's husband was that she was walking and slipped on a rock in the river and fell.  Her head was not underwater at any point.  He reports that after that and the car she was acting like she had a difficult time answering questions and had to actively stop and think about them in order to answer them.  Patient reports nausea without vomiting.  She reports dizziness and neck pain.  She states that she feels like she has electric shocks in her arms and legs that she describes as feeling like his cell phone is vibrating.  She reports light sensitivity. No recent sickness, illness, fevers, cough.  No chest pain, back pain, or abdominal pain.  No pains in arms or legs.  She reports that a few months ago she fell injuring her tailbone.  Her tailbone had been better but that area is now sore again.      HPI  Past Medical History:  Diagnosis Date  . Asthma   . Esophageal web     There are no active problems to display for this patient.   Past Surgical History:  Procedure Laterality Date  . ESOPHAGOGASTRODUODENOSCOPY N/A 04/07/2016   Procedure: ESOPHAGOGASTRODUODENOSCOPY (EGD);  Surgeon:  Kathi DerParag Brahmbhatt, MD;  [facility];  Service: Gastroenterology;  Laterality: N/A;     OB History   No obstetric history on file.      Home Medications    Prior to Admission medications   Medication Sig Start Date End Date Taking? Authorizing Provider  clotrimazole (LOTRIMIN) 1 % cream Apply to affected area 2 times daily 12/11/17   Graciella FreerLayden, Lindsey A, PA-C  lidocaine (XYLOCAINE) 2 % solution Use as directed 15 mLs in the mouth or throat as needed for mouth pain. 05/22/16   Garlon HatchetSanders, Lisa M, PA-C  methocarbamol (ROBAXIN) 500 MG tablet Take 1 tablet (500 mg total) by mouth every 8 (eight) hours as needed. 05/28/18   Petrucelli, Samantha R, PA-C  naproxen (NAPROSYN) 500 MG tablet Take 1 tablet (500 mg total) by mouth 2 (two) times daily. 05/28/18   Petrucelli, Samantha R, PA-C  ondansetron (ZOFRAN ODT) 4 MG disintegrating tablet Take 1 tablet (4 mg total) by mouth every 6 (six) hours as needed for nausea or vomiting. 01/31/19   Cristina GongHammond, Jemina Scahill W, PA-C  pantoprazole (PROTONIX) 40 MG tablet Take 1 tablet (40 mg total) by mouth 2 (two) times daily. Patient taking differently: Take 40 mg by mouth daily.  04/08/16   Tomasita Crumbleni, Adeleke, MD    Family History No family history on file.  Social History Social History  Tobacco Use  . Smoking status: Never Smoker  . Smokeless tobacco: Never Used  Substance Use Topics  . Alcohol use: Yes  . Drug use: Not on file     Allergies   Patient has no known allergies.   Review of Systems Review of Systems  Constitutional: Negative for chills and fever.  HENT: Negative for congestion.   Eyes: Positive for photophobia. Negative for visual disturbance.  Respiratory: Negative for shortness of breath.   Cardiovascular: Negative for chest pain.  Gastrointestinal: Positive for nausea. Negative for abdominal pain, diarrhea and vomiting.  Genitourinary: Negative for dysuria and urgency.  Musculoskeletal: Positive for neck pain. Negative for back  pain.  Neurological: Positive for dizziness and headaches.       Has to stop and think before answering a question  Psychiatric/Behavioral: Negative for confusion. The patient is not nervous/anxious.   All other systems reviewed and are negative.    Physical Exam Updated Vital Signs BP 104/78   Pulse 76   Temp 97.9 F (36.6 C) (Oral)   Resp 14   SpO2 100%   Physical Exam Vitals signs and nursing note reviewed.  Constitutional:      General: She is not in acute distress.    Appearance: She is well-developed. She is not diaphoretic.  HENT:     Head: Normocephalic.     Comments: TTP with mild redness on the vertex of the scalp with out crepitus or deformity. No active bleeding.  No raccoon's eyes or battle signs bilaterally.  No hemotympanum bilaterally. Eyes:     General: No scleral icterus.       Right eye: No discharge.        Left eye: No discharge.     Extraocular Movements: Extraocular movements intact.     Conjunctiva/sclera: Conjunctivae normal.     Pupils: Pupils are equal, round, and reactive to light.  Neck:     Musculoskeletal: Normal range of motion and neck supple. No muscular tenderness.  Cardiovascular:     Rate and Rhythm: Normal rate and regular rhythm.     Pulses: Normal pulses.     Heart sounds: Normal heart sounds.  Pulmonary:     Effort: Pulmonary effort is normal. No respiratory distress.     Breath sounds: Normal breath sounds. No stridor.  Abdominal:     General: There is no distension.     Tenderness: There is no abdominal tenderness.  Musculoskeletal:        General: No deformity.     Right lower leg: No edema.     Left lower leg: No edema.  Skin:    General: Skin is warm and dry.  Neurological:     General: No focal deficit present.     Mental Status: She is alert. Mental status is at baseline.     Motor: No abnormal muscle tone.     Comments: Alert and oriented to person, place, and time.  She moves arms and legs spontaneously.  5/5  strength in bilateral upper and lower extremities.  Sensation intact to light touch to arms and legs bilaterally.  Cranial nerves intact with pupils equal round reactive to light, no visual field deficit.  Gait is normal without evidence of ataxia.  Speech is not slurred, no facial droop.  Psychiatric:        Behavior: Behavior normal.      ED Treatments / Results  Labs (all labs ordered are listed, but only abnormal results are displayed) Labs Reviewed -  No data to display  EKG EKG Interpretation  Date/Time:  Sunday January 31 2019 14:33:02 EDT Ventricular Rate:  67 PR Interval:    QRS Duration: 93 QT Interval:  410 QTC Calculation: 433 R Axis:   68 Text Interpretation:  Sinus rhythm Borderline short PR interval No significant change since last tracing Confirmed by Gareth Morgan [phone]) on 01/31/2019 5:25:45 PM   Radiology Ct Head Wo Contrast  Result Date: 01/31/2019 CLINICAL DATA:  Fall, headache, loss of consciousness EXAM: CT HEAD WITHOUT CONTRAST TECHNIQUE: Contiguous axial images were obtained from the base of the skull through the vertex without intravenous contrast. COMPARISON:  None. FINDINGS: Brain: No evidence of acute infarction, hemorrhage, hydrocephalus, extra-axial collection or mass lesion/mass effect. Vascular: No hyperdense vessel or unexpected calcification. Skull: Normal. Negative for fracture or focal lesion. Sinuses/Orbits: No acute finding. Other: None. IMPRESSION: No acute intracranial pathology. Electronically Signed   By: Eddie Candle M.D.   On: 01/31/2019 15:11   Ct Cervical Spine Wo Contrast  Result Date: 01/31/2019 CLINICAL DATA:  Cervical spine trauma EXAM: CT CERVICAL SPINE WITHOUT CONTRAST TECHNIQUE: Multidetector CT imaging of the cervical spine was performed without intravenous contrast. Multiplanar CT image reconstructions were also generated. COMPARISON:  None FINDINGS: Alignment: Normal Skull base and vertebrae: Osseous mineralization normal. Skull  base intact. Vertebral body and disc space heights maintained. No fracture, subluxation, or bone destruction. Soft tissues and spinal canal: Prevertebral soft tissues normal thickness. Cervical soft tissues grossly unremarkable. Disc levels:  No significant abnormalities Upper chest: Lung apices clear Other: N/A IMPRESSION: Normal exam. Electronically Signed   By: Lavonia Dana M.D.   On: 01/31/2019 16:06    Procedures Procedures (including critical care time)  Medications Ordered in ED Medications  ondansetron (ZOFRAN-ODT) disintegrating tablet 4 mg (4 mg Oral Given 01/31/19 1728)  acetaminophen (TYLENOL) tablet 1,000 mg (1,000 mg Oral Given 01/31/19 1729)     Initial Impression / Assessment and Plan / ED Course  I have reviewed the triage vital signs and the nursing notes.  Pertinent labs & imaging results that were available during my care of the patient were reviewed by me and considered in my medical decision making (see chart for details).  Clinical Course as of Jan 30 2301  Sun Jan 31, 2019  1756 Patient reevaluated, she has passed p.o. challenge.  She is ambulatory in the room without difficulty.  Return precautions discussed with patient and her husband.   [EH]    Clinical Course User Index [EH] Lorin Glass, PA-C      Patient presents today for evaluation of head injury with loss of consciousness after a mechanical slip will tubing in a river today.  CT head and neck were both obtained without evidence of acute abnormality.  EKG does not show acute ischemia or cause for a syncopal event.  Suspect that this was a mechanical fall as there were multiple slippery rocks in the area.  She was observed in the emergency room for 4 hours without significant worsening in condition.  She was given Tylenol, and Zofran after which she was able to p.o. challenge, walk without difficulty with a normal neuro exam.  She is given follow-up with the concussion clinic.    Return precautions  were discussed with patient and her husband who states their understanding.  At the time of discharge patient denied any unaddressed complaints or concerns.  Patient is agreeable for discharge home.   Final Clinical Impressions(s) / ED Diagnoses   Final diagnoses:  Fall, initial encounter  Concussion with loss of consciousness of 30 minutes or less, initial encounter  Neck pain    ED Discharge Orders         Ordered    ondansetron (ZOFRAN ODT) 4 MG disintegrating tablet  Every 6 hours PRN     01/31/19 1801           Cristina GongHammond, Ludger Bones W, PA-C 01/31/19 2305    Alvira MondaySchlossman, Erin, MD 02/01/19 1538

## 2019-01-31 NOTE — ED Triage Notes (Signed)
Patient's husband, Gavriela Cashin, would like updates when possible: [phone].

## 2019-02-01 ENCOUNTER — Telehealth: Payer: Self-pay

## 2019-02-01 NOTE — Telephone Encounter (Signed)
Patient suffered a head injury yesterday 01/31/2019 when she slipped on a rock while she was walking in a river. Patient fell and hit her head on a rock. LOC for less than 1 minute. No history of head injuries. Has been having a headache, fatigue, photo and phonophobia. Has not tried to work today. Works on Teaching laboratory technician as an Administrator. On schedule for tomorrow morning.

## 2019-02-01 NOTE — Progress Notes (Signed)
Subjective:   I, Wilford GristValerie Wolf, am serving as a scribe for Dr. Antoine PrimasZachary Brenya Taulbee.  Chief Complaint: Sheila Graham, DOB: Jan 02, 1981, is a 38 y.o. female who presents for head injury suffered when she slipped on a rock and hit her head. LOC + for less than one minute. No history of head injuries. Has been having headaches, fatigue and photophobia. Did use Tylenol for headache yesterday. 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 Has not returned to work.  Patient did have CT of the head and neck done when she was in the emergency room.  These were independently visualized by me showing no significant acute injuries.  No underlying arthritic changes  Injury date : 01/31/2019 Visit #: 1   History of Present Illness:    Concussion Self-Reported Symptom Score Symptoms rated on a scale 1-6, in last 24 hours  Headache: 4   Nausea:2  Vomiting:0  Balance Difficulty: 0  Dizziness: 1  Fatigue: 6  Trouble Falling Asleep:6   Sleep More Than Usual:4  Sleep Less Than Usual: 0  Daytime Drowsiness: 3  Photophobia: 4  Phonophobia: 4  Irritability: 1  Sadness:4  Nervousness: 3  Feeling More Emotional: 3  Numbness or Tingling: 1, right leg  Feeling Slowed Down:3  Feeling Mentally Foggy:3  Difficulty Concentrating: 0  Difficulty Remembering: 3  Visual Problems: 0     Total Symptom Score: 65   Review of Systems:  No , visual changes, nausea, vomiting, diarrhea, constipation, abdominal pain, skin rash, fevers, chills, night sweats, weight loss, swollen lymph nodes, body aches, joint swelling, muscle aches, chest pain, shortness of breath, mood changes.   +Headache, dizziness   Review of History: Past Medical History:  Past Medical History:  Diagnosis Date  . Asthma   . Esophageal web     Past Surgical History:  has a past surgical history that includes Esophagogastroduodenoscopy (N/A, 04/07/2016). Family History: family history is not on file. no family history  of autoimmune Social History:  reports that she has never smoked. She has never used smokeless tobacco. She reports current alcohol use. No history on file for drug. Current Medications: has a current medication list which includes the following prescription(s): clotrimazole, lidocaine, methocarbamol, naproxen, ondansetron, pantoprazole, and tizanidine. Allergies: has No Known Allergies.  Objective:    Physical Examination Vitals:   02/02/19 0737  BP: 102/70  Pulse: 67  SpO2: 97%   General: No apparent distress alert and oriented x3 mood and affect normal, dressed appropriately.  HEENT: Pupils equal, extraocular movements intact very mild nystagmus noted Respiratory: Patient's speak in full sentences and does not appear short of breath  Cardiovascular: No lower extremity edema, non tender, no erythema  Skin: Warm dry intact with no signs of infection or rash on extremities or on axial skeleton.  Abdomen: Soft nontender  Neuro: Cranial nerves II through XII are intact, neurovascularly intact in all extremities with 2+ DTRs and 2+ pulses.  Lymph: No lymphadenopathy of posterior or anterior cervical chain or axillae bilaterally.  Gait normal with good balance and coordination.  MSK:  Non tender with full range of motion and good stability and symmetric strength and tone of shoulders, elbows, wrist,  knee and ankles bilaterally.  Psychiatric: Oriented X3, intact recent and remote memory, judgement and insight, normal mood and affect  Neck exam does show some loss of lordosis.  Patient has limited range of motion in all planes of lack degrees.  Significant tightness of the  musculature.  Tender to palpation in that area as well.  Negative Spurling's.  Neurovascular intact distally in the upper extremities  Concussion testing performed today:    Vestibular Screening:   Headache  Dizziness  Smooth Pursuits n n  H. Saccades n m  V. Saccades n m  H. VOR n n  V. VOR n n  Visual Motor  Sensitivity n n      Convergence: 0cm  n n   Balance Screen:30/30  Additional testing performed today:    Assessment:    No diagnosis found.  Sheila Graham presents with the following concussion subtypes. [] Cognitive [x] Cervical [] Vestibular [] Ocular [] Migraine [] Anxiety/Mood   Plan:   Action/Discussion: Reviewed diagnosis, management options, expected outcomes, and the reasons for scheduled and emergent follow-up. Questions were adequately answered. Patient expressed verbal understanding and agreement with the following plan.     Active Treatment Strategies:  Fueling your brain is important for recovery. It is essential to stay well hydrated, aiming for half of your body weight in fluid ounces per day (100 lbs = 50 oz). We also recommend eating breakfast to start your day and focus on a well-balanced diet containing lean protein, 'good' fats, and complex carbohydrates. See your nutrition / hydration handout for more details.   Quality sleep is vital in your concussion recovery. We encourage lots of sleep for the first 24-72 hours after injury but following this period it is important to regulate your sleep cycle. We encourage 8 hours of quality sleep per night. See your sleep handout for more details and strategies to quality sleep.  I  Treating your vestibular and visual dysfunction will decrease your recovery time and improve your symptoms. Begin your home vestibular exercise program as directed on your AVS.    Begin taking DHA supplement as directed.  .  Patient Education:  Reviewed with patient the risks (i.e, a repeat concussion, post-concussion syndrome, second-impact syndrome) of returning to play prior to complete resolution, and thoroughly reviewed the signs and symptoms of concussion.Reviewed need for complete resolution of all symptoms, with rest AND exertion, prior to return to play.  Reviewed red flags for urgent medical evaluation: worsening symptoms,  nausea/vomiting, intractable headache, musculoskeletal changes, focal neurological deficits.  [facility] Concussion Care Plan, which clearly outlines the plans stated above, was given to patient.   The above was documentation by clinical scribe has been reviewed and is accurate and complete   In addition to the time spent performing tests, I spent 45 min face to face w/ pt with greater than 50% of that time in counseling on:   Reviewed with patient the risks (i.e, a repeat concussion, post-concussion syndrome, second-impact syndrome) of returning to play prior to complete resolution, and thoroughly reviewed the signs and symptoms of      concussion. Reviewedf need for complete resolution of all symptoms, with rest AND exertion, prior to return to play.  Reviewed red flags for urgent medical evaluation: worsening symptoms, nausea/vomiting, intractable headache, musculoskeletal changes, focal neurological deficits.  [facility] Concussion Care Plan, which clearly outlines the plans stated above, was given to patient   After Visit Summary printed out and provided to patient as appropriate.

## 2019-02-02 ENCOUNTER — Encounter: Payer: Self-pay | Admitting: Family Medicine

## 2019-02-02 ENCOUNTER — Ambulatory Visit: Payer: Federal, State, Local not specified - PPO | Admitting: Family Medicine

## 2019-02-02 ENCOUNTER — Other Ambulatory Visit: Payer: Self-pay

## 2019-02-02 DIAGNOSIS — S134XXA Sprain of ligaments of cervical spine, initial encounter: Secondary | ICD-10-CM | POA: Diagnosis not present

## 2019-02-02 MED ORDER — TIZANIDINE HCL 4 MG PO TABS: 4.0000 mg | ORAL_TABLET | Freq: Every day | ORAL | 0 refills | Status: DC

## 2019-02-02 NOTE — Patient Instructions (Addendum)
Good to see you 200mg  CoQ10 2 grams of Fish oil 2000 IU of Vitamin D Zanaflex at night Out of work for one week  See me again in one week

## 2019-02-02 NOTE — Assessment & Plan Note (Signed)
I believe the patient has more of a whiplash injury.  Started on Zanaflex for nighttime pain.  Discontinue the Robaxin.  Discussed the naproxen.  Discussed over-the-counter medications.  Mild possible concussion noted.  We discussed with patient in great length about her medications that could be helpful.  Patient wants to hold on formal physical.  Follow-up with me emergently in 1 to 2 weeks

## 2019-02-04 ENCOUNTER — Ambulatory Visit: Payer: Federal, State, Local not specified - PPO | Admitting: Family Medicine

## 2019-02-09 ENCOUNTER — Ambulatory Visit (INDEPENDENT_AMBULATORY_CARE_PROVIDER_SITE_OTHER): Payer: Federal, State, Local not specified - PPO | Admitting: Family Medicine

## 2019-02-09 ENCOUNTER — Encounter: Payer: Self-pay | Admitting: Family Medicine

## 2019-02-09 DIAGNOSIS — S134XXA Sprain of ligaments of cervical spine, initial encounter: Secondary | ICD-10-CM

## 2019-02-09 NOTE — Progress Notes (Signed)
Virtual Visit via Video Note  I connected with Sheila Graham on 02/09/19 at 12:30 PM EDT by a video enabled telemedicine application and verified that I am speaking with the correct person using two identifiers.  Location: Patient: Patient was in home setting Provider: Office setting   I discussed the limitations of evaluation and management by telemedicine and the availability of in person appointments. The patient expressed understanding and agreed to proceed.  History of Present Illness: Patient is a 38 year old female following up with more of a whiplash injuries as well as a mild concussion.  Patient states that actually the neck is feeling significantly better but unfortunately continues to have dizziness when she moves her head on and off quickly.  Patient feels like sometimes she has a nauseous as well.  Patient not denies any true headaches at the moment.  Would state that she is feeling maybe 50% better.  No side effects to any other vitamin supplementations    Observations/Objective: Alert and oriented x3, in good recall, very pleasant to talk to him,   Assessment and Plan: Whiplash, resolved, still mild concussion, will take some more time to completely resolve.  Patient okay to do more of an exercise protocol, we also discussed starting part-time work in the next 48 hours of 4-hour shifts for the next week.  Total of 20 hours a week.  Patient will follow-up with me again in the next 10 days to further evaluation with hopefully full release.   Follow Up Instructions: 1 week to 10 days    I discussed the assessment and treatment plan with the patient. The patient was provided an opportunity to ask questions and all were answered. The patient agreed with the plan and demonstrated an understanding of the instructions.   The patient was advised to call back or seek an in-person evaluation if the symptoms worsen or if the condition fails to improve as anticipated.  I provided15  minutes of non-face-to-face time during this encounter.   Lyndal Pulley, DO

## 2019-02-18 ENCOUNTER — Ambulatory Visit (INDEPENDENT_AMBULATORY_CARE_PROVIDER_SITE_OTHER): Payer: Federal, State, Local not specified - PPO | Admitting: Family Medicine

## 2019-02-18 ENCOUNTER — Encounter: Payer: Self-pay | Admitting: Family Medicine

## 2019-02-18 VITALS — Ht 65.0 in

## 2019-02-18 DIAGNOSIS — S134XXA Sprain of ligaments of cervical spine, initial encounter: Secondary | ICD-10-CM

## 2019-02-18 NOTE — Progress Notes (Signed)
Virtual Visit via Video Note  I connected with Sheila Graham on 02/18/19 at  9:45 AM EDT by a video enabled telemedicine application and verified that I am speaking with the correct person using two identifiers.  Location: Patient: Home setting Provider: *Office setting   I discussed the limitations of evaluation and management by telemedicine and the availability of in person appointments. The patient expressed understanding and agreed to proceed.  History of Present Illness: Patient is following up for more of a whiplash injuries than true concussion.  Patient wants to take anti-inflammatories, muscle relaxer, home exercises and icing regimen.  Patient states he 95% better at this time.  Patient states of feeling like herself except for very mild word finding difficulties.  Still gets tired when she is on computer long amount of time but no real symptoms such as the neck pain or headaches.    Observations/Objective: Alert and oriented x3, very pleasant.  More energy than previous exam   Assessment and Plan: Whiplash and concussion resolved fully released at this time.  Patient knows if any worsening symptoms to contact us again.   Follow Up Instructions: PRN    I discussed the assessment and treatment plan with the patient. The patient was provided an opportunity to ask questions and all were answered. The patient agreed with the plan and demonstrated an understanding of the instructions.   The patient was advised to call back or seek an in-person evaluation if the symptoms worsen or if the condition fails to improve as anticipated.  I provided 16 minutes of face-to-face time during this encounter.   Lyndal Pulley, DO

## 2019-03-10 ENCOUNTER — Encounter: Payer: Self-pay | Admitting: Family Medicine

## 2019-04-07 ENCOUNTER — Encounter: Payer: Self-pay | Admitting: Family Medicine

## 2019-04-07 MED ORDER — ONDANSETRON 4 MG PO TBDP: 4.0000 mg | ORAL_TABLET | Freq: Four times a day (QID) | ORAL | 0 refills | Status: DC | PRN

## 2019-05-12 ENCOUNTER — Other Ambulatory Visit: Payer: Self-pay | Admitting: Family Medicine

## 2019-06-13 ENCOUNTER — Other Ambulatory Visit: Payer: Self-pay | Admitting: Family Medicine

## 2019-06-28 ENCOUNTER — Other Ambulatory Visit: Payer: Self-pay | Admitting: Family Medicine

## 2019-07-12 ENCOUNTER — Encounter (HOSPITAL_COMMUNITY): Payer: Self-pay | Admitting: Emergency Medicine

## 2019-07-12 ENCOUNTER — Emergency Department (HOSPITAL_COMMUNITY)
Admission: EM | Admit: 2019-07-12 | Discharge: 2019-07-13 | Disposition: A | Discharge status: Home / Self Care | Payer: Federal, State, Local not specified - PPO | Attending: Emergency Medicine | Admitting: Emergency Medicine

## 2019-07-12 ENCOUNTER — Other Ambulatory Visit: Payer: Self-pay

## 2019-07-12 DIAGNOSIS — Z79899 Other long term (current) drug therapy: Secondary | ICD-10-CM | POA: Insufficient documentation

## 2019-07-12 DIAGNOSIS — R252 Cramp and spasm: Secondary | ICD-10-CM | POA: Diagnosis not present

## 2019-07-12 DIAGNOSIS — M79604 Pain in right leg: Secondary | ICD-10-CM | POA: Insufficient documentation

## 2019-07-12 DIAGNOSIS — J45909 Unspecified asthma, uncomplicated: Secondary | ICD-10-CM | POA: Diagnosis not present

## 2019-07-12 LAB — CBC WITH DIFFERENTIAL/PLATELET
Abs Immature Granulocytes: 0.04 10*3/uL (ref 0.00–0.07)
Basophils Absolute: 0 10*3/uL (ref 0.0–0.1)
Basophils Relative: 0 %
Eosinophils Absolute: 0.4 10*3/uL (ref 0.0–0.5)
Eosinophils Relative: 4 %
HCT: 45.3 % (ref 36.0–46.0)
Hemoglobin: 15 g/dL (ref 12.0–15.0)
Immature Granulocytes: 0 %
Lymphocytes Relative: 29 %
Lymphs Abs: 3.4 10*3/uL (ref 0.7–4.0)
MCH: 30.6 pg (ref 26.0–34.0)
MCHC: 33.1 g/dL (ref 30.0–36.0)
MCV: 92.4 fL (ref 80.0–100.0)
Monocytes Absolute: 0.7 10*3/uL (ref 0.1–1.0)
Monocytes Relative: 6 %
Neutro Abs: 7.1 10*3/uL (ref 1.7–7.7)
Neutrophils Relative %: 61 %
Platelets: 332 10*3/uL (ref 150–400)
RBC: 4.9 MIL/uL (ref 3.87–5.11)
RDW: 12.3 % (ref 11.5–15.5)
WBC: 11.7 10*3/uL — ABNORMAL HIGH (ref 4.0–10.5)
nRBC: 0 % (ref 0.0–0.2)

## 2019-07-12 LAB — BASIC METABOLIC PANEL
Anion gap: 8 (ref 5–15)
BUN: 14 mg/dL (ref 6–20)
CO2: 26 mmol/L (ref 22–32)
Calcium: 9.6 mg/dL (ref 8.9–10.3)
Chloride: 103 mmol/L (ref 98–111)
Creatinine, Ser: 0.83 mg/dL (ref 0.44–1.00)
GFR calc Af Amer: >60 mL/min (ref >60)
GFR calc non Af Amer: >60 mL/min (ref >60)
Glucose, Bld: 100 mg/dL — ABNORMAL HIGH (ref 70–99)
Potassium: 4 mmol/L (ref 3.5–5.1)
Sodium: 137 mmol/L (ref 135–145)

## 2019-07-12 NOTE — ED Triage Notes (Signed)
Patient here with right leg pain, states that it is in the calf.  Patient does have an area of tightness, warmth to the area.  She denies any long travel, flights or oral birth control.  She states that she sat at work all weekend which was different for her.  She does have some trouble walking.

## 2019-07-13 ENCOUNTER — Other Ambulatory Visit: Payer: Self-pay

## 2019-07-13 ENCOUNTER — Emergency Department (HOSPITAL_BASED_OUTPATIENT_CLINIC_OR_DEPARTMENT_OTHER): Payer: Federal, State, Local not specified - PPO

## 2019-07-13 DIAGNOSIS — R52 Pain, unspecified: Secondary | ICD-10-CM

## 2019-07-13 MED ORDER — CYCLOBENZAPRINE HCL 5 MG PO TABS: 5.0000 mg | ORAL_TABLET | Freq: Three times a day (TID) | ORAL | 0 refills | Status: DC | PRN

## 2019-07-13 NOTE — Discharge Instructions (Addendum)
Please call your primary doctor to schedule follow-up appointment later this week for recheck of your leg pain.  Recommend taking anti-inflammatory such as Motrin or naproxen.  Additionally recommend taking the prescribed muscle relaxer as needed.  Please note this can make you drowsy and should not be taken while driving or operating heavy machinery.  If you have any significant worsening of your pain, develop numbness, weakness, skin color changes (such as purple, redness), any significant swelling, please return to ER for reassessment.

## 2019-07-13 NOTE — ED Notes (Signed)
Pt discharge instructions and prescriptions reviewed with the patient. The patient verbalized understanding of both. Pt discharged. 

## 2019-07-13 NOTE — Progress Notes (Signed)
Lower extremity venous has been completed.   Preliminary results in CV Proc.   Sheila Graham 07/13/2019 8:46 AM

## 2019-07-13 NOTE — ED Provider Notes (Signed)
[facility] EMERGENCY DEPARTMENT Provider Note   CSN: [id number] Arrival date & time: 07/12/19  2214     History   Chief Complaint Chief Complaint  Patient presents with  . Leg Pain    HPI Sheila Graham is a 38 y.o. female.  Presents to the ER with right leg pain.  Patient states for the past couple days she has noted some pain, described as a cramping sensation, seems to come and go at random, predominantly in her right lower leg.  No associated left leg pain, no right thigh pain.  No recent trauma.  Ambulating without difficulty.  Has not noted any rash or skin color changes.  No change in sensation, no change in motor.  Patient denies any chronic medical problems.  No chronic medications.  Has not take any medication for this.     HPI  Past Medical History:  Diagnosis Date  . Asthma   . Esophageal web     Patient Active Problem List   Diagnosis Date Noted  . Whiplash injuries, initial encounter 02/02/2019    Past Surgical History:  Procedure Laterality Date  . ESOPHAGOGASTRODUODENOSCOPY N/A 04/07/2016   Procedure: ESOPHAGOGASTRODUODENOSCOPY (EGD);  Surgeon: Kathi Der, MD;  Location: [facility] ENDOSCOPY;  Service: Gastroenterology;  Laterality: N/A;     OB History   No obstetric history on file.      Home Medications    Prior to Admission medications   Medication Sig Start Date End Date Taking? Authorizing Provider  clotrimazole (LOTRIMIN) 1 % cream Apply to affected area 2 times daily 12/11/17   Graciella Freer A, PA-C  cyclobenzaprine (FLEXERIL) 5 MG tablet Take 1 tablet (5 mg total) by mouth 3 (three) times daily as needed for muscle spasms. 07/13/19   Milagros Loll, MD  lidocaine (XYLOCAINE) 2 % solution Use as directed 15 mLs in the mouth or throat as needed for mouth pain. 05/22/16   Garlon Hatchet, PA-C  naproxen (NAPROSYN) 500 MG tablet Take 1 tablet (500 mg total) by mouth 2 (two) times daily. 05/28/18   Petrucelli, Samantha R,  PA-C  ondansetron (ZOFRAN ODT) 4 MG disintegrating tablet Take 1 tablet (4 mg total) by mouth every 6 (six) hours as needed for nausea or vomiting. 04/07/19   Judi Saa, DO  pantoprazole (PROTONIX) 40 MG tablet Take 1 tablet (40 mg total) by mouth 2 (two) times daily. Patient taking differently: Take 40 mg by mouth daily.  04/08/16   Tomasita Crumble, MD  tiZANidine (ZANAFLEX) 4 MG tablet TAKE 1 TABLET (4 MG TOTAL) BY MOUTH AT BEDTIME. 06/28/19   Judi Saa, DO    Family History History reviewed. No pertinent family history.  Social History Social History   Tobacco Use  . Smoking status: Never Smoker  . Smokeless tobacco: Never Used  Substance Use Topics  . Alcohol use: Yes  . Drug use: Not on file     Allergies   Patient has no known allergies.   Review of Systems Review of Systems  Constitutional: Negative for chills and fever.  HENT: Negative for ear pain and sore throat.   Eyes: Negative for pain and visual disturbance.  Respiratory: Negative for cough and shortness of breath.   Cardiovascular: Negative for chest pain and palpitations.  Gastrointestinal: Negative for abdominal pain and vomiting.  Genitourinary: Negative for dysuria and hematuria.  Musculoskeletal: Positive for arthralgias. Negative for back pain.  Skin: Negative for color change and rash.  Neurological: Negative for  seizures and syncope.  All other systems reviewed and are negative.    Physical Exam Updated Vital Signs BP 115/78 (BP Location: Right Arm)   Pulse (!) 56   Temp 98.8 F (37.1 C) (Oral)   Resp 16   SpO2 98%   Physical Exam Vitals signs and nursing note reviewed.  Constitutional:      General: She is not in acute distress.    Appearance: She is well-developed.  HENT:     Head: Normocephalic and atraumatic.  Eyes:     Conjunctiva/sclera: Conjunctivae normal.  Neck:     Musculoskeletal: Neck supple.  Cardiovascular:     Rate and Rhythm: Normal rate and regular rhythm.      Heart sounds: No murmur.  Pulmonary:     Effort: Pulmonary effort is normal. No respiratory distress.     Breath sounds: Normal breath sounds.  Abdominal:     Palpations: Abdomen is soft.     Tenderness: There is no abdominal tenderness.  Musculoskeletal:     Comments: RLE: No focal tenderness to palpation throughout extremity, no joint effusion, normal joint range of motion in all joints, normal DP and PT pulses, sensation intact throughout, 5/5 strength throughout  Skin:    General: Skin is warm and dry.     Capillary Refill: Capillary refill takes less than 2 seconds.  Neurological:     General: No focal deficit present.     Mental Status: She is alert and oriented to person, place, and time.      ED Treatments / Results  Labs (all labs ordered are listed, but only abnormal results are displayed) Labs Reviewed  CBC WITH DIFFERENTIAL/PLATELET - Abnormal; Notable for the following components:      Result Value   WBC 11.7 (*)    All other components within normal limits  BASIC METABOLIC PANEL - Abnormal; Notable for the following components:   Glucose, Bld 100 (*)    All other components within normal limits    EKG None  Radiology Vas [place] Lower Extremity Venous (dvt) (only Mc & Wl 7a-7p)  Result Date: 07/13/2019  Lower Venous Study Indications: Pain.  Comparison Study: no prior Performing Technologist: Blanch Media RVS  Examination Guidelines: A complete evaluation includes B-mode imaging, spectral Doppler, color Doppler, and power Doppler as needed of all accessible portions of each vessel. Bilateral testing is considered an integral part of a complete examination. Limited examinations for reoccurring indications may be performed as noted.  +---------+---------------+---------+-----------+----------+--------------+ RIGHT    CompressibilityPhasicitySpontaneityPropertiesThrombus Aging +---------+---------------+---------+-----------+----------+--------------+ CFV       Full           Yes      Yes                                 +---------+---------------+---------+-----------+----------+--------------+ SFJ      Full                                                        +---------+---------------+---------+-----------+----------+--------------+ FV Prox  Full                                                        +---------+---------------+---------+-----------+----------+--------------+  FV Mid   Full                                                        +---------+---------------+---------+-----------+----------+--------------+ FV DistalFull                                                        +---------+---------------+---------+-----------+----------+--------------+ PFV      Full                                                        +---------+---------------+---------+-----------+----------+--------------+ POP      Full           Yes      Yes                                 +---------+---------------+---------+-----------+----------+--------------+ PTV      Full                                                        +---------+---------------+---------+-----------+----------+--------------+ PERO     Full                                                        +---------+---------------+---------+-----------+----------+--------------+     Summary: Right: There is no evidence of deep vein thrombosis in the lower extremity. No cystic structure found in the popliteal fossa.  *See table(s) above for measurements and observations.    Preliminary     Procedures Procedures (including critical care time)  Medications Ordered in ED Medications - No data to display   Initial Impression / Assessment and Plan / ED Course  I have reviewed the triage vital signs and the nursing notes.  Pertinent labs & imaging results that were available during my care of the patient were reviewed by me and considered in my medical  decision making (see chart for details).        38 year old lady presents to ER with isolated atraumatic right lower leg pain.  On exam patient is well-appearing with no other complaints, normal vital signs.  Lower extremity DVT study negative for acute DVT.  She is neurovascularly intact.  No trauma, bearing weight without difficulty.  BMP without electrolyte abnormality.  Suspect patient is having muscle cramps.  Will prescribe muscle relaxer and recommend NSAIDs.  Recommend recheck with PCP.  Reviewed return precautions, will discharge home.  After the discussed management above, the patient was determined to be safe for discharge.  The patient was in agreement with this plan and all questions regarding their care were answered.  ED return precautions were discussed  and the patient will return to the ED with any significant worsening of condition.   Final Clinical Impressions(s) / ED Diagnoses   Final diagnoses:  Right leg pain  Muscle cramp    ED Discharge Orders         Ordered    cyclobenzaprine (FLEXERIL) 5 MG tablet  3 times daily PRN     07/13/19 0918           Lucrezia Starch, MD 07/13/19 [phone]

## 2019-09-14 DIAGNOSIS — R635 Abnormal weight gain: Secondary | ICD-10-CM | POA: Diagnosis not present

## 2019-09-17 DIAGNOSIS — M25512 Pain in left shoulder: Secondary | ICD-10-CM | POA: Diagnosis not present

## 2019-09-17 DIAGNOSIS — R1010 Upper abdominal pain, unspecified: Secondary | ICD-10-CM | POA: Diagnosis not present

## 2019-09-17 DIAGNOSIS — Z79899 Other long term (current) drug therapy: Secondary | ICD-10-CM | POA: Diagnosis not present

## 2019-09-17 DIAGNOSIS — N3000 Acute cystitis without hematuria: Secondary | ICD-10-CM | POA: Diagnosis not present

## 2019-09-17 DIAGNOSIS — K8080 Other cholelithiasis without obstruction: Secondary | ICD-10-CM | POA: Diagnosis not present

## 2019-09-17 DIAGNOSIS — N281 Cyst of kidney, acquired: Secondary | ICD-10-CM | POA: Diagnosis not present

## 2019-09-17 DIAGNOSIS — Z793 Long term (current) use of hormonal contraceptives: Secondary | ICD-10-CM | POA: Diagnosis not present

## 2019-09-17 DIAGNOSIS — R1011 Right upper quadrant pain: Secondary | ICD-10-CM | POA: Diagnosis not present

## 2019-09-17 DIAGNOSIS — N200 Calculus of kidney: Secondary | ICD-10-CM | POA: Diagnosis not present

## 2019-09-17 DIAGNOSIS — K8020 Calculus of gallbladder without cholecystitis without obstruction: Secondary | ICD-10-CM | POA: Diagnosis not present

## 2019-09-22 DIAGNOSIS — R1011 Right upper quadrant pain: Secondary | ICD-10-CM | POA: Diagnosis not present

## 2019-09-22 DIAGNOSIS — K8020 Calculus of gallbladder without cholecystitis without obstruction: Secondary | ICD-10-CM | POA: Diagnosis not present

## 2019-09-22 DIAGNOSIS — N3000 Acute cystitis without hematuria: Secondary | ICD-10-CM | POA: Diagnosis not present

## 2019-09-27 DIAGNOSIS — N3000 Acute cystitis without hematuria: Secondary | ICD-10-CM | POA: Diagnosis not present

## 2019-09-27 DIAGNOSIS — S134XXA Sprain of ligaments of cervical spine, initial encounter: Secondary | ICD-10-CM | POA: Diagnosis not present

## 2019-09-27 DIAGNOSIS — K8020 Calculus of gallbladder without cholecystitis without obstruction: Secondary | ICD-10-CM | POA: Diagnosis not present

## 2019-10-12 DIAGNOSIS — R1084 Generalized abdominal pain: Secondary | ICD-10-CM | POA: Diagnosis not present

## 2019-10-12 DIAGNOSIS — E538 Deficiency of other specified B group vitamins: Secondary | ICD-10-CM | POA: Diagnosis not present

## 2019-10-12 DIAGNOSIS — Z79899 Other long term (current) drug therapy: Secondary | ICD-10-CM | POA: Diagnosis not present

## 2019-10-12 DIAGNOSIS — E559 Vitamin D deficiency, unspecified: Secondary | ICD-10-CM | POA: Diagnosis not present

## 2019-10-12 DIAGNOSIS — Z03818 Encounter for observation for suspected exposure to other biological agents ruled out: Secondary | ICD-10-CM | POA: Diagnosis not present

## 2019-10-14 DIAGNOSIS — R1084 Generalized abdominal pain: Secondary | ICD-10-CM | POA: Diagnosis not present

## 2019-11-10 DIAGNOSIS — F4325 Adjustment disorder with mixed disturbance of emotions and conduct: Secondary | ICD-10-CM | POA: Diagnosis not present

## 2019-11-10 DIAGNOSIS — F4312 Post-traumatic stress disorder, chronic: Secondary | ICD-10-CM | POA: Diagnosis not present

## 2019-11-17 DIAGNOSIS — N915 Oligomenorrhea, unspecified: Secondary | ICD-10-CM | POA: Diagnosis not present

## 2019-11-17 DIAGNOSIS — E559 Vitamin D deficiency, unspecified: Secondary | ICD-10-CM | POA: Diagnosis not present

## 2019-11-17 DIAGNOSIS — R1084 Generalized abdominal pain: Secondary | ICD-10-CM | POA: Diagnosis not present

## 2019-11-17 DIAGNOSIS — R635 Abnormal weight gain: Secondary | ICD-10-CM | POA: Diagnosis not present

## 2019-12-04 DIAGNOSIS — N281 Cyst of kidney, acquired: Secondary | ICD-10-CM | POA: Diagnosis not present

## 2019-12-04 DIAGNOSIS — K449 Diaphragmatic hernia without obstruction or gangrene: Secondary | ICD-10-CM | POA: Diagnosis not present

## 2019-12-04 DIAGNOSIS — R1084 Generalized abdominal pain: Secondary | ICD-10-CM | POA: Diagnosis not present

## 2019-12-04 DIAGNOSIS — R109 Unspecified abdominal pain: Secondary | ICD-10-CM | POA: Diagnosis not present

## 2019-12-04 DIAGNOSIS — Z79899 Other long term (current) drug therapy: Secondary | ICD-10-CM | POA: Diagnosis not present

## 2019-12-04 DIAGNOSIS — Z791 Long term (current) use of non-steroidal anti-inflammatories (NSAID): Secondary | ICD-10-CM | POA: Diagnosis not present

## 2019-12-04 DIAGNOSIS — R112 Nausea with vomiting, unspecified: Secondary | ICD-10-CM | POA: Diagnosis not present

## 2019-12-04 DIAGNOSIS — R197 Diarrhea, unspecified: Secondary | ICD-10-CM | POA: Diagnosis not present

## 2019-12-04 DIAGNOSIS — N3001 Acute cystitis with hematuria: Secondary | ICD-10-CM | POA: Diagnosis not present

## 2019-12-14 DIAGNOSIS — M549 Dorsalgia, unspecified: Secondary | ICD-10-CM | POA: Diagnosis not present

## 2019-12-14 DIAGNOSIS — N281 Cyst of kidney, acquired: Secondary | ICD-10-CM | POA: Diagnosis not present

## 2019-12-17 DIAGNOSIS — E559 Vitamin D deficiency, unspecified: Secondary | ICD-10-CM | POA: Diagnosis not present

## 2019-12-17 DIAGNOSIS — Z131 Encounter for screening for diabetes mellitus: Secondary | ICD-10-CM | POA: Diagnosis not present

## 2019-12-17 DIAGNOSIS — Z0000 Encounter for general adult medical examination without abnormal findings: Secondary | ICD-10-CM | POA: Diagnosis not present

## 2019-12-17 DIAGNOSIS — Z1331 Encounter for screening for depression: Secondary | ICD-10-CM | POA: Diagnosis not present

## 2019-12-17 DIAGNOSIS — Z03818 Encounter for observation for suspected exposure to other biological agents ruled out: Secondary | ICD-10-CM | POA: Diagnosis not present

## 2019-12-17 DIAGNOSIS — Z09 Encounter for follow-up examination after completed treatment for conditions other than malignant neoplasm: Secondary | ICD-10-CM | POA: Diagnosis not present

## 2019-12-17 DIAGNOSIS — R1084 Generalized abdominal pain: Secondary | ICD-10-CM | POA: Diagnosis not present

## 2019-12-17 DIAGNOSIS — Z114 Encounter for screening for human immunodeficiency virus [HIV]: Secondary | ICD-10-CM | POA: Diagnosis not present

## 2019-12-17 DIAGNOSIS — Z13220 Encounter for screening for lipoid disorders: Secondary | ICD-10-CM | POA: Diagnosis not present

## 2019-12-17 DIAGNOSIS — N915 Oligomenorrhea, unspecified: Secondary | ICD-10-CM | POA: Diagnosis not present

## 2019-12-17 DIAGNOSIS — Z1159 Encounter for screening for other viral diseases: Secondary | ICD-10-CM | POA: Diagnosis not present

## 2019-12-21 DIAGNOSIS — R1310 Dysphagia, unspecified: Secondary | ICD-10-CM | POA: Diagnosis not present

## 2019-12-21 DIAGNOSIS — Z01818 Encounter for other preprocedural examination: Secondary | ICD-10-CM | POA: Diagnosis not present

## 2019-12-21 DIAGNOSIS — R1084 Generalized abdominal pain: Secondary | ICD-10-CM | POA: Diagnosis not present

## 2019-12-25 DIAGNOSIS — Z20822 Contact with and (suspected) exposure to covid-19: Secondary | ICD-10-CM | POA: Diagnosis not present

## 2019-12-25 DIAGNOSIS — Z01818 Encounter for other preprocedural examination: Secondary | ICD-10-CM | POA: Diagnosis not present

## 2019-12-27 DIAGNOSIS — R1310 Dysphagia, unspecified: Secondary | ICD-10-CM | POA: Diagnosis not present

## 2019-12-27 DIAGNOSIS — Z1211 Encounter for screening for malignant neoplasm of colon: Secondary | ICD-10-CM | POA: Diagnosis not present

## 2020-01-03 DIAGNOSIS — K900 Celiac disease: Secondary | ICD-10-CM | POA: Diagnosis not present

## 2020-01-03 DIAGNOSIS — K2970 Gastritis, unspecified, without bleeding: Secondary | ICD-10-CM | POA: Diagnosis not present

## 2020-01-03 DIAGNOSIS — A048 Other specified bacterial intestinal infections: Secondary | ICD-10-CM | POA: Diagnosis not present

## 2020-01-03 DIAGNOSIS — K2270 Barrett's esophagus without dysplasia: Secondary | ICD-10-CM | POA: Diagnosis not present

## 2020-01-04 DIAGNOSIS — K200 Eosinophilic esophagitis: Secondary | ICD-10-CM | POA: Diagnosis not present

## 2020-01-12 DIAGNOSIS — K200 Eosinophilic esophagitis: Secondary | ICD-10-CM | POA: Diagnosis not present

## 2020-01-13 DIAGNOSIS — R635 Abnormal weight gain: Secondary | ICD-10-CM | POA: Diagnosis not present

## 2020-01-13 DIAGNOSIS — N915 Oligomenorrhea, unspecified: Secondary | ICD-10-CM | POA: Diagnosis not present

## 2020-01-13 DIAGNOSIS — R5383 Other fatigue: Secondary | ICD-10-CM | POA: Diagnosis not present

## 2020-01-14 IMAGING — CT CT CERVICAL SPINE WITHOUT CONTRAST
4 series · 15 of 33 positions shown, 18 images · non-contrast
Comparison: None

CLINICAL DATA: Cervical spine trauma

EXAM:
CT CERVICAL SPINE WITHOUT CONTRAST
TECHNIQUE: Multidetector CT imaging of the cervical spine was performed without
intravenous contrast. Multiplanar CT image reconstructions were also
generated.

[Series 4: c_spine 2.0 st · axial · 0.40mm/px · z∈[-217,-105]mm · 5 of 85 slices shown, 7 images]
[im 15/85  soft-tissue]
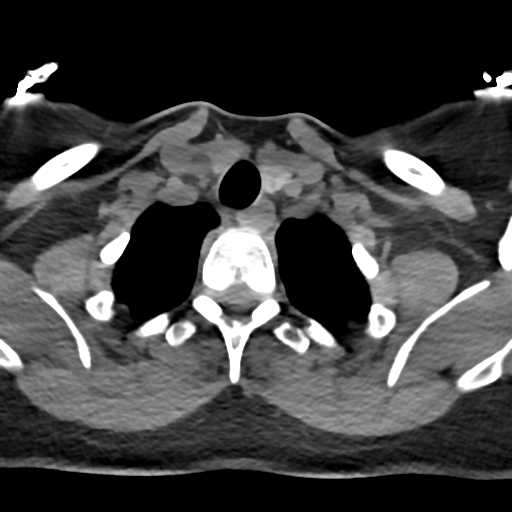
[im 15/85  bone]
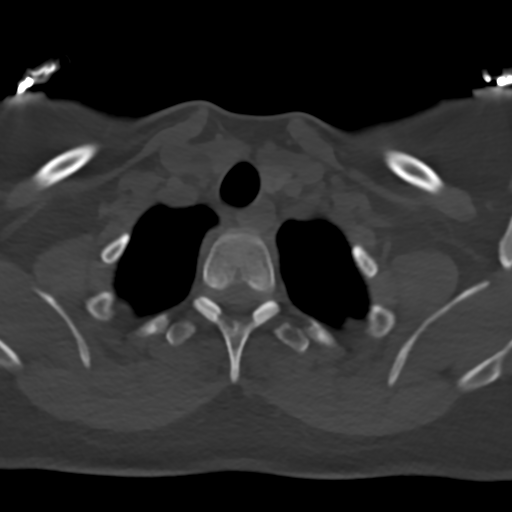
[im 29/85  bone]
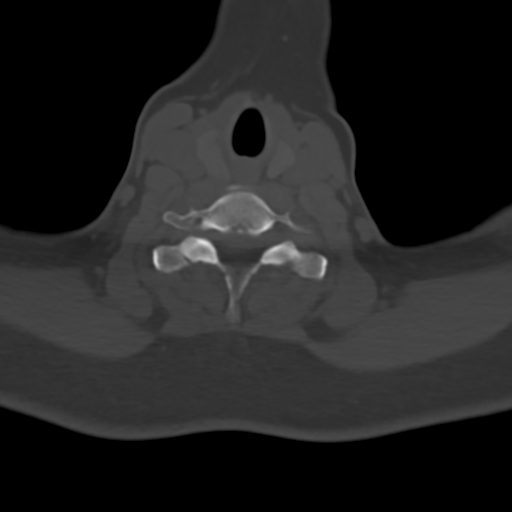
[im 43/85  bone]
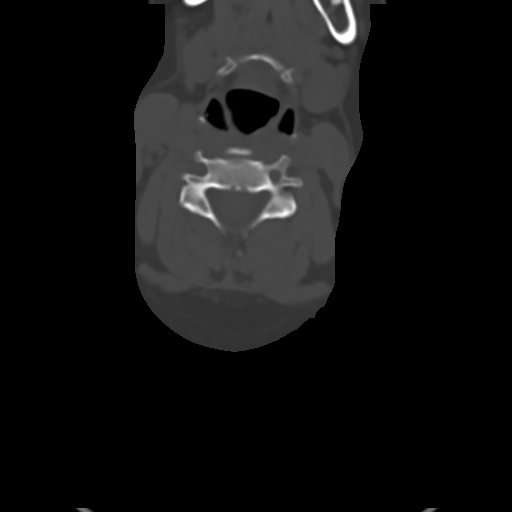
[im 57/85  bone]
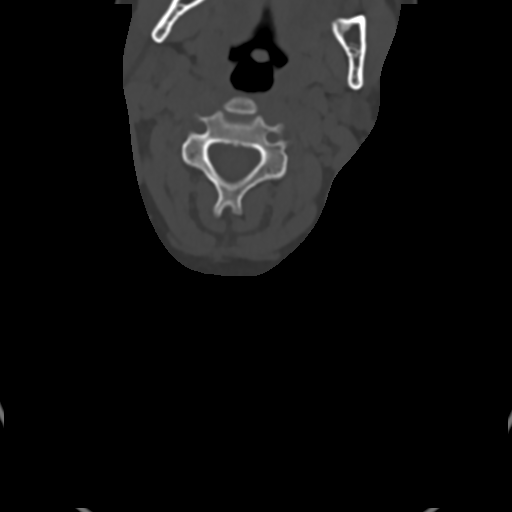
[im 71/85  soft-tissue]
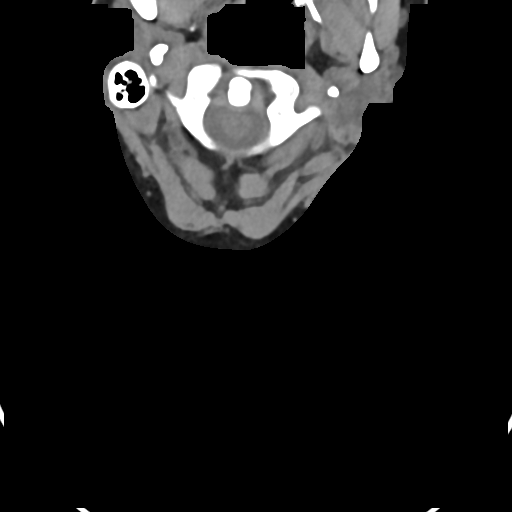
[im 71/85  bone]
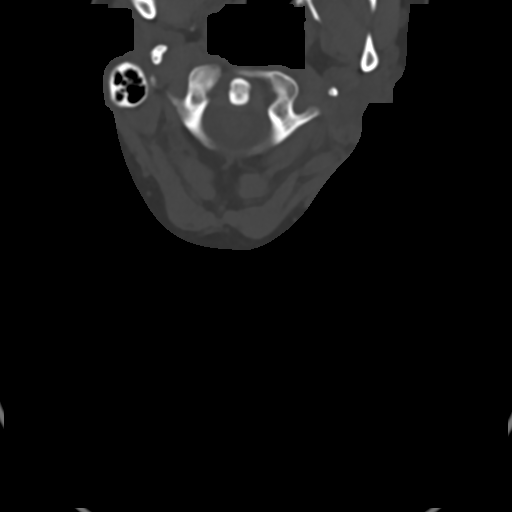

[Series 6: c_spine 2.0 sag bone · sagittal · 0.38mm/px · 5 of 65 slices shown, 6 images]
[im 22/65  bone]
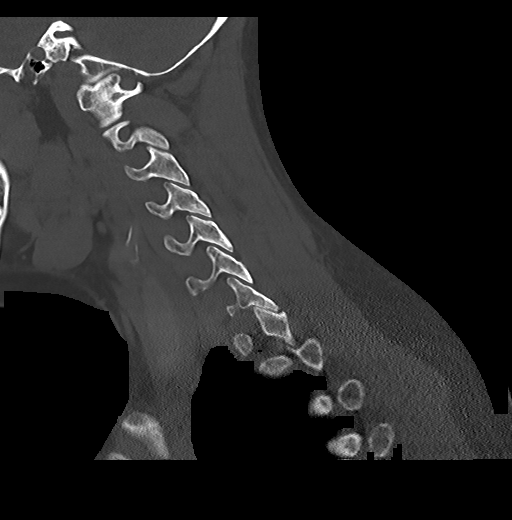
[im 27/65  bone]
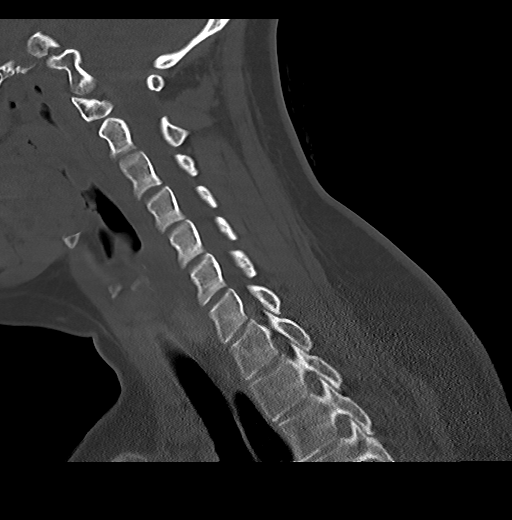
[im 33/65  soft-tissue]
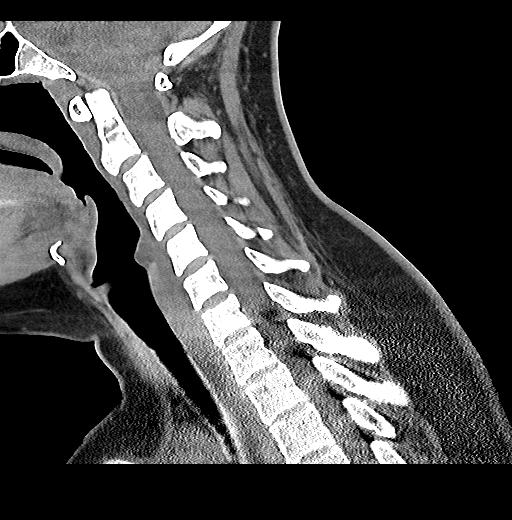
[im 33/65  bone]
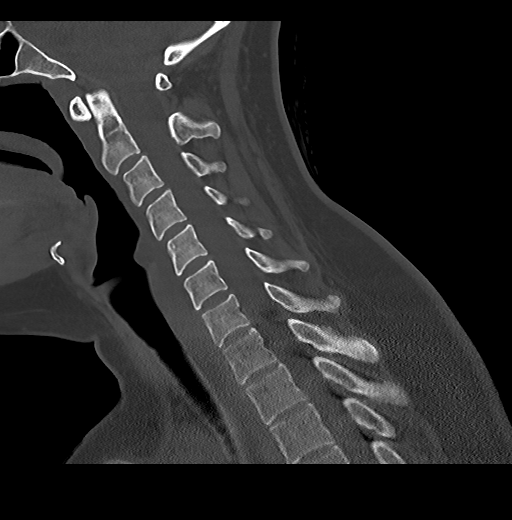
[im 38/65  bone]
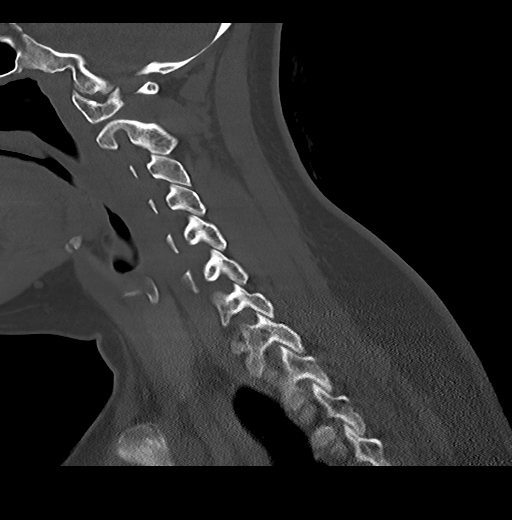
[im 43/65  bone]
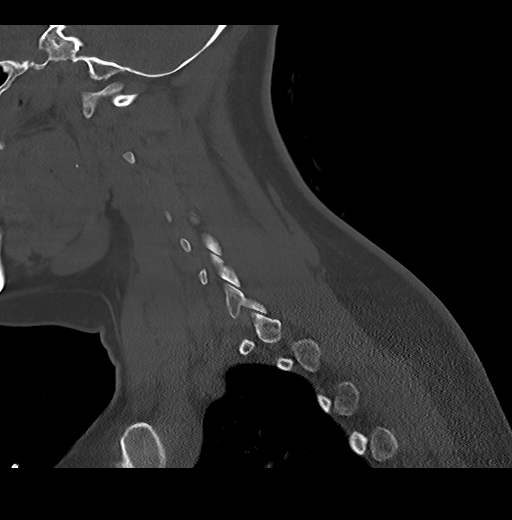

[Series 7: c_spine 2.0 cor bone · coronal · 0.25mm/px · 3 of 95 slices shown]
[im 19/95  bone]
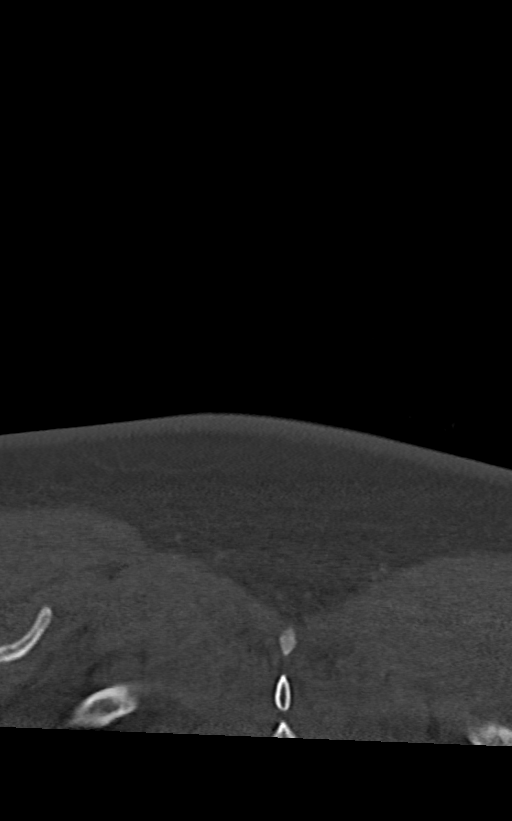
[im 38/95  bone]
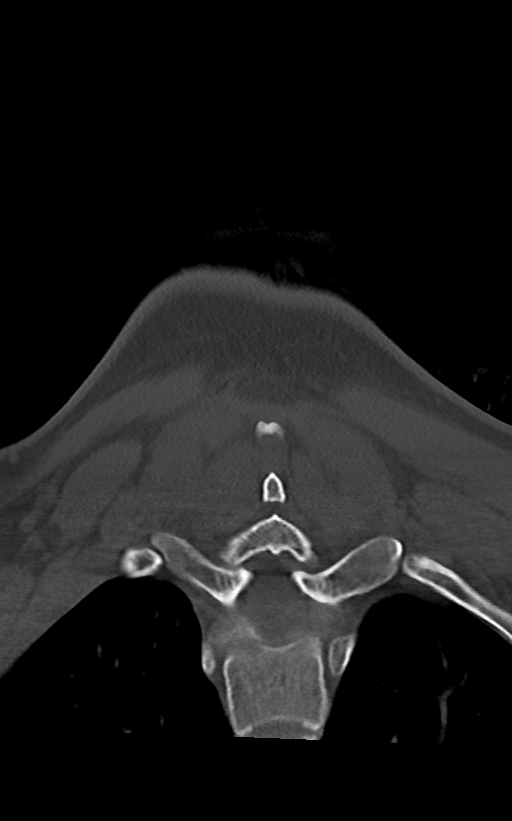
[im 57/95  bone]
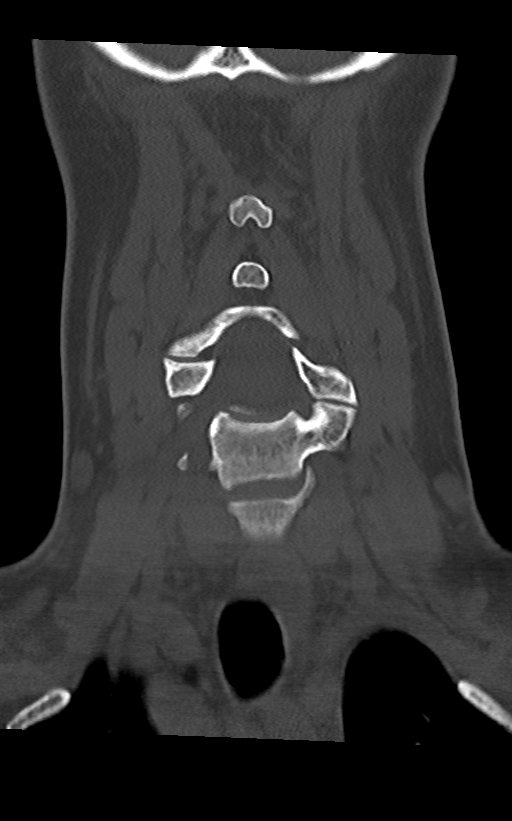

[Series 9: c_spine 2.0 orthogonals · axial · 0.21mm/px · z∈[-228,-208]mm · 2 of 95 slices shown]
[im 16/95  bone]
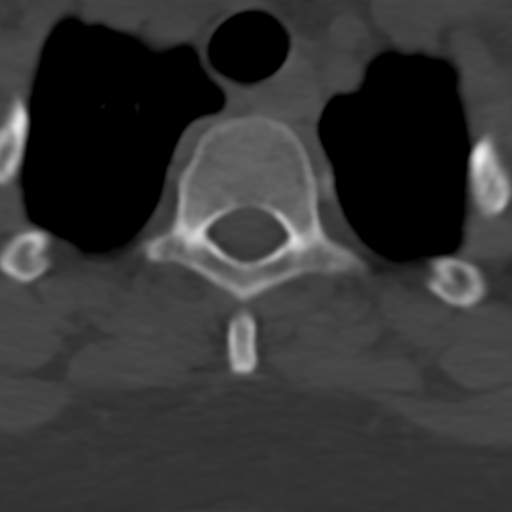
[im 32/95  bone]
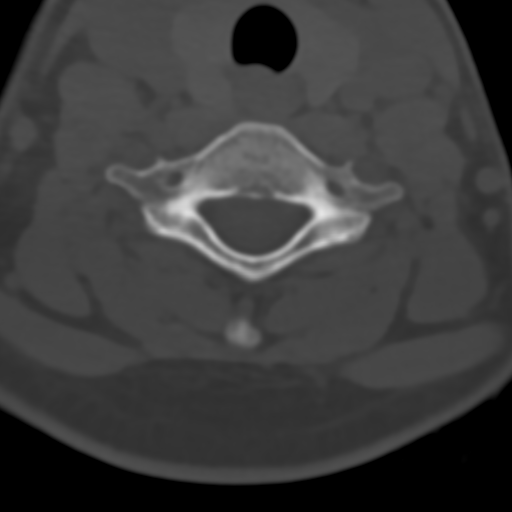

[15 of 33 positions shown; findings below may reference images not displayed]

FINDINGS: Alignment: Normal

Skull base and vertebrae: Osseous mineralization normal. Skull base
intact. Vertebral body and disc space heights maintained. No
fracture, subluxation, or bone destruction.

Soft tissues and spinal canal: Prevertebral soft tissues normal
thickness. Cervical soft tissues grossly unremarkable.

Disc levels:  No significant abnormalities

Upper chest: Lung apices clear

Other: N/A
IMPRESSION: Normal exam.

## 2020-08-12 DIAGNOSIS — Z1152 Encounter for screening for COVID-19: Secondary | ICD-10-CM | POA: Diagnosis not present

## 2020-08-16 DIAGNOSIS — U071 COVID-19: Secondary | ICD-10-CM | POA: Diagnosis not present

## 2020-08-29 DIAGNOSIS — Z23 Encounter for immunization: Secondary | ICD-10-CM | POA: Diagnosis not present

## 2020-08-29 DIAGNOSIS — U099 Post covid-19 condition, unspecified: Secondary | ICD-10-CM | POA: Diagnosis not present

## 2020-08-29 DIAGNOSIS — Z79899 Other long term (current) drug therapy: Secondary | ICD-10-CM | POA: Diagnosis not present

## 2020-10-03 DIAGNOSIS — Z30431 Encounter for routine checking of intrauterine contraceptive device: Secondary | ICD-10-CM | POA: Diagnosis not present

## 2020-10-26 DIAGNOSIS — R110 Nausea: Secondary | ICD-10-CM | POA: Diagnosis not present

## 2020-10-26 DIAGNOSIS — R109 Unspecified abdominal pain: Secondary | ICD-10-CM | POA: Diagnosis not present

## 2020-10-26 DIAGNOSIS — N200 Calculus of kidney: Secondary | ICD-10-CM | POA: Diagnosis not present

## 2020-12-27 DIAGNOSIS — N281 Cyst of kidney, acquired: Secondary | ICD-10-CM | POA: Diagnosis not present

## 2021-03-27 DIAGNOSIS — S161XXA Strain of muscle, fascia and tendon at neck level, initial encounter: Secondary | ICD-10-CM | POA: Diagnosis not present

## 2021-06-11 DIAGNOSIS — S46912A Strain of unspecified muscle, fascia and tendon at shoulder and upper arm level, left arm, initial encounter: Secondary | ICD-10-CM | POA: Diagnosis not present

## 2021-06-25 DIAGNOSIS — D1801 Hemangioma of skin and subcutaneous tissue: Secondary | ICD-10-CM | POA: Diagnosis not present

## 2021-06-25 DIAGNOSIS — L821 Other seborrheic keratosis: Secondary | ICD-10-CM | POA: Diagnosis not present

## 2021-06-25 DIAGNOSIS — L814 Other melanin hyperpigmentation: Secondary | ICD-10-CM | POA: Diagnosis not present

## 2021-06-25 DIAGNOSIS — L810 Postinflammatory hyperpigmentation: Secondary | ICD-10-CM | POA: Diagnosis not present

## 2021-07-03 DIAGNOSIS — Z79899 Other long term (current) drug therapy: Secondary | ICD-10-CM | POA: Diagnosis not present

## 2021-07-03 DIAGNOSIS — R0602 Shortness of breath: Secondary | ICD-10-CM | POA: Diagnosis not present

## 2021-07-03 DIAGNOSIS — Z23 Encounter for immunization: Secondary | ICD-10-CM | POA: Diagnosis not present

## 2021-07-03 DIAGNOSIS — R635 Abnormal weight gain: Secondary | ICD-10-CM | POA: Diagnosis not present

## 2021-07-03 DIAGNOSIS — Z6829 Body mass index (BMI) 29.0-29.9, adult: Secondary | ICD-10-CM | POA: Diagnosis not present

## 2021-07-03 DIAGNOSIS — R5383 Other fatigue: Secondary | ICD-10-CM | POA: Diagnosis not present

## 2021-07-24 DIAGNOSIS — Z3202 Encounter for pregnancy test, result negative: Secondary | ICD-10-CM | POA: Diagnosis not present

## 2021-07-24 DIAGNOSIS — Z30433 Encounter for removal and reinsertion of intrauterine contraceptive device: Secondary | ICD-10-CM | POA: Diagnosis not present

## 2021-07-24 DIAGNOSIS — Z01419 Encounter for gynecological examination (general) (routine) without abnormal findings: Secondary | ICD-10-CM | POA: Diagnosis not present

## 2021-07-25 ENCOUNTER — Encounter (HOSPITAL_BASED_OUTPATIENT_CLINIC_OR_DEPARTMENT_OTHER): Payer: Self-pay

## 2021-07-25 ENCOUNTER — Emergency Department (HOSPITAL_BASED_OUTPATIENT_CLINIC_OR_DEPARTMENT_OTHER): Payer: Federal, State, Local not specified - PPO

## 2021-07-25 ENCOUNTER — Emergency Department (HOSPITAL_BASED_OUTPATIENT_CLINIC_OR_DEPARTMENT_OTHER)
Admission: EM | Admit: 2021-07-25 | Discharge: 2021-07-25 | Disposition: A | Discharge status: Home / Self Care | Payer: Federal, State, Local not specified - PPO | Attending: Emergency Medicine | Admitting: Emergency Medicine

## 2021-07-25 ENCOUNTER — Ambulatory Visit: Payer: Self-pay | Admitting: *Deleted

## 2021-07-25 ENCOUNTER — Other Ambulatory Visit: Payer: Self-pay

## 2021-07-25 DIAGNOSIS — R202 Paresthesia of skin: Secondary | ICD-10-CM | POA: Diagnosis not present

## 2021-07-25 DIAGNOSIS — J45909 Unspecified asthma, uncomplicated: Secondary | ICD-10-CM | POA: Diagnosis not present

## 2021-07-25 DIAGNOSIS — M79602 Pain in left arm: Secondary | ICD-10-CM | POA: Insufficient documentation

## 2021-07-25 DIAGNOSIS — R600 Localized edema: Secondary | ICD-10-CM | POA: Diagnosis not present

## 2021-07-25 DIAGNOSIS — R531 Weakness: Secondary | ICD-10-CM | POA: Insufficient documentation

## 2021-07-25 LAB — PREGNANCY, URINE: Preg Test, Ur: NEGATIVE

## 2021-07-25 NOTE — ED Triage Notes (Signed)
Pt presents with pain, swelling, and bruising to L upper arm. Pt reports intermittent tingling. Pt denies injury.

## 2021-07-25 NOTE — Discharge Instructions (Signed)
Your DVT scan today was negative for any blood clots. You can take over-the-counter anti-inflammatories such as Ibuprofen for your pain. You can alternate this with Tylenol. Please follow-up with your primary care provider soon as possible for further work-up of your symptoms.

## 2021-07-25 NOTE — ED Notes (Signed)
Patient transported to Ultrasound 

## 2021-07-25 NOTE — Telephone Encounter (Signed)
°  Chief Complaint: sore area on left arm Symptoms: throbbing pain with numbness and tingling intermittently Frequency: daily Pertinent Negatives: Patient denies no injuries Disposition: [] ED /[x] Urgent Care (no appt availability in office) / [] Appointment(In office/virtual)/ []  [facility]/ [] Home Care/ [] Refused Recommended Disposition  Additional Notes: She doesn't have a PCP so I referred her to the urgent care since the pain is getting worse and numbness and tingling are occurring.   She was agreeable to this plan.

## 2021-07-25 NOTE — ED Provider Notes (Signed)
[facility] EMERGENCY DEPT Provider Note   CSN: [id number] Arrival date & time: 07/25/21  1214     History Chief Complaint  Patient presents with   Arm Swelling    Sheila Graham is a 40 y.o. female. Patient presents the emergency department with 1 month of left upper arm swelling and associated bruising.  She states that the swelling disappeared 1 day, however the pain is gradually worsened since then.  The pain has been constant.  She says it feels like a throbbing and or dull sensation.  She also has had associated weakness, numbness, and tingling in that upper extremity that extends down her arms and into her hands.  She says this used to be more intermittent, however is now constant.  She denies any trauma to the area. She does have a primary care provider, however she presented to the emergency department because one of her friends told her she could have a blood clot.  HPI     Past Medical History:  Diagnosis Date   Asthma    Esophageal web     Patient Active Problem List   Diagnosis Date Noted   Whiplash injuries, initial encounter 02/02/2019    Past Surgical History:  Procedure Laterality Date   ESOPHAGOGASTRODUODENOSCOPY N/A 04/07/2016   Procedure: ESOPHAGOGASTRODUODENOSCOPY (EGD);  Surgeon: Kathi Der, MD;  Location: [facility] ENDOSCOPY;  Service: Gastroenterology;  Laterality: N/A;   HERNIA REPAIR       OB History   No obstetric history on file.     No family history on file.  Social History   Tobacco Use   Smoking status: Never   Smokeless tobacco: Never  Substance Use Topics   Alcohol use: Yes   Drug use: Never    Home Medications Prior to Admission medications   Medication Sig Start Date End Date Taking? Authorizing Provider  levonorgestrel (KYLEENA) 19.5 MG IUD See admin instructions. iud 11/22/16  Yes [provider]  phentermine (ADIPEX-P) 37.5 MG tablet Take 37.5 mg by mouth daily. 07/05/21  Yes [provider]  clotrimazole (LOTRIMIN) 1 % cream Apply to affected area 2 times daily Patient not taking: Reported on 07/25/2021 12/11/17   Graciella Freer A, PA-C  cyclobenzaprine (FLEXERIL) 5 MG tablet Take 1 tablet (5 mg total) by mouth 3 (three) times daily as needed for muscle spasms. Patient not taking: Reported on 07/25/2021 07/13/19   Milagros Loll, MD  lidocaine (XYLOCAINE) 2 % solution Use as directed 15 mLs in the mouth or throat as needed for mouth pain. Patient not taking: Reported on 07/25/2021 05/22/16   Garlon Hatchet, PA-C  naproxen (NAPROSYN) 500 MG tablet Take 1 tablet (500 mg total) by mouth 2 (two) times daily. Patient not taking: Reported on 07/25/2021 05/28/18   Petrucelli, Samantha R, PA-C  ondansetron (ZOFRAN ODT) 4 MG disintegrating tablet Take 1 tablet (4 mg total) by mouth every 6 (six) hours as needed for nausea or vomiting. Patient not taking: Reported on 07/25/2021 04/07/19   Judi Saa, DO  pantoprazole (PROTONIX) 40 MG tablet Take 1 tablet (40 mg total) by mouth 2 (two) times daily. Patient not taking: Reported on 07/25/2021 04/08/16   Tomasita Crumble, MD  tiZANidine (ZANAFLEX) 4 MG tablet TAKE 1 TABLET (4 MG TOTAL) BY MOUTH AT BEDTIME. Patient not taking: Reported on 07/25/2021 06/28/19   Judi Saa, DO    Allergies    Patient has no known allergies.  Review of Systems   Review of Systems  Musculoskeletal:        Swelling of LUE, Bruising of LUE  Neurological:  Positive for weakness and numbness.       Tingling  All other systems reviewed and are negative.  Physical Exam Updated Vital Signs BP 118/82 (BP Location: Right Arm)    Pulse 69    Temp 98.1 F (36.7 C)    Resp 19    Ht 5\' 5"  (1.651 m)    Wt 74.8 kg    LMP 07/15/2021    SpO2 99%    BMI 27.46 kg/m   Physical Exam Vitals and nursing note reviewed.  Constitutional:      General: She is not in acute distress.    Appearance: Normal appearance. She is well-developed. She is not ill-appearing,  toxic-appearing or diaphoretic.  HENT:     Head: Normocephalic and atraumatic.     Nose: No nasal deformity.     Mouth/Throat:     Lips: Pink. No lesions.  Eyes:     General: Gaze aligned appropriately. No scleral icterus.       Right eye: No discharge.        Left eye: No discharge.     Conjunctiva/sclera: Conjunctivae normal.     Right eye: Right conjunctiva is not injected. No exudate or hemorrhage.    Left eye: Left conjunctiva is not injected. No exudate or hemorrhage. Neck:     Comments: No cervical ML ttp or stepoffs Cardiovascular:     Pulses: Normal pulses.     Comments: Bilateral upper extremity pulses 2+ Pulmonary:     Effort: Pulmonary effort is normal. No respiratory distress.  Musculoskeletal:        General: Swelling and tenderness present. No signs of injury.     Cervical back: Normal range of motion and neck supple. No tenderness.     Left lower leg: Edema present.     Comments: Has some mild left upper arm edema localized to the deltoid area.  She has a tattoo overlying the site.  Do not notice any bruising overlying this.  Swelling has also localized to the bicep region of the left upper arm.  Skin:    General: Skin is warm and dry.     Capillary Refill: Capillary refill takes less than 2 seconds.     Coloration: Skin is not pale.     Findings: No bruising.     Comments: No obvious bruising to the left upper extremity where patient indicates the bruising is.    Neurological:     Mental Status: She is alert and oriented to person, place, and time.     Comments: Numbness in the left upper extremity, however sensation is equal in bilateral upper extremities.  Strength 5 out of 5 in bilateral upper extremities.  Psychiatric:        Mood and Affect: Mood normal.        Speech: Speech normal.        Behavior: Behavior normal. Behavior is cooperative.    ED Results / Procedures / Treatments   Labs (all labs ordered are listed, but only abnormal results are  displayed) Labs Reviewed  PREGNANCY, URINE    EKG None  Radiology 14/06/2021 Venous Img Upper Left (DVT Study)  Result Date: 07/25/2021 CLINICAL DATA:  Left upper extremity pain and edema. EXAM: LEFT UPPER EXTREMITY VENOUS DOPPLER ULTRASOUND TECHNIQUE: Gray-scale sonography with graded compression, as well as color Doppler and duplex ultrasound were performed to evaluate the upper extremity deep  venous system from the level of the subclavian vein and including the jugular, axillary, basilic, radial, ulnar and upper cephalic vein. Spectral Doppler was utilized to evaluate flow at rest and with distal augmentation maneuvers. COMPARISON:  None. FINDINGS: Contralateral Subclavian Vein: Respiratory phasicity is normal and symmetric with the symptomatic side. No evidence of thrombus. Normal compressibility. Internal Jugular Vein: No evidence of thrombus. Normal compressibility, respiratory phasicity and response to augmentation. Subclavian Vein: No evidence of thrombus. Normal compressibility, respiratory phasicity and response to augmentation. Axillary Vein: No evidence of thrombus. Normal compressibility, respiratory phasicity and response to augmentation. Cephalic Vein: No evidence of thrombus. Normal compressibility, respiratory phasicity and response to augmentation. Basilic Vein: No evidence of thrombus. Normal compressibility, respiratory phasicity and response to augmentation. Brachial Veins: No evidence of thrombus. Normal compressibility, respiratory phasicity and response to augmentation. Radial Veins: No evidence of thrombus. Normal compressibility, respiratory phasicity and response to augmentation. Ulnar Veins: No evidence of thrombus. Normal compressibility, respiratory phasicity and response to augmentation. Venous Reflux:  None visualized. Other Findings: No evidence of superficial thrombophlebitis or abnormal fluid collection. IMPRESSION: No evidence of DVT within the left upper extremity.  Electronically Signed   By: Irish Lack M.D.   On: 07/25/2021 14:13    Procedures Procedures   Medications Ordered in ED Medications - No data to display  ED Course  I have reviewed the triage vital signs and the nursing notes.  Pertinent labs & imaging results that were available during my care of the patient were reviewed by me and considered in my medical decision making (see chart for details).    MDM Rules/Calculators/A&P                          Patient presents with 1 month of atraumatic left upper arm swelling with worsening pain and paresthesias.  Reviewed previous records, patient has no notable past medical history. Vital stable She is neurovascularly intact on exam.  Pulses are equal bilaterally.  There is some minimal to moderate left upper arm swelling with tenderness to palpation.  No obvious bruising to this area.  Symptoms are isolated and patient without risk factors, so doubt SVC syndrome. Other considerations are lymphedema, msk in etiology. Normal vascular exam, not concerned for arterial occlusion. Doubt cellulitis as area does not appear cellulitic and patient with no infectious symptoms.   Concern for possible venous blood clot v thrombophlebitis.  We will order DVT study for left upper extremity.  1413: DVT study negative.  Recommend following up with PCP and supportive pain treatment at home.   Final Clinical Impression(s) / ED Diagnoses Final diagnoses:  Left arm pain    Rx / DC Orders ED Discharge Orders     None        Claudie Leach, PA-C 07/25/21 1710    Long, Arlyss Repress, MD 07/26/21 [phone]

## 2021-07-25 NOTE — Telephone Encounter (Signed)
Reason for Disposition  Numbness (i.e., loss of sensation) in hand or fingers  Answer Assessment - Initial Assessment Questions 1. ONSET: "When did the pain start?"     Pt calling c/o blood clot in left arm.   Called the community line.   I have a place on arm that is blue and swollen.   I saw a dermatologist and ortho dr they don't know what it is.   A friend told me I have a blood clot.  I'm weaker and my arm is numb and tingling.   Doesn't have a PCP.   The ortho thinks it's muscle.    2. LOCATION: "Where is the pain located?"     Left arm No accidents or injuries.   Started working out in gym in Sept.    3. PAIN: "How bad is the pain?" (Scale 1-10; or mild, moderate, severe)   - MILD (1-3): doesn't interfere with normal activities   - MODERATE (4-7): interferes with normal activities (e.g., work or school) or awakens from sleep   - SEVERE (8-10): excruciating pain, unable to do any normal activities, unable to hold a cup of water     It throbbing pain all the time now. 4. WORK OR EXERCISE: "Has there been any recent work or exercise that involved this part of the body?"     No 5. CAUSE: "What do you think is causing the arm pain?"     My friend told me I could have a blood clot and now I'm a little worried about it. 6. OTHER SYMPTOMS: "Do you have any other symptoms?" (e.g., neck pain, swelling, rash, fever, numbness, weakness)     Left arm intermittent numbness and tingling.   The area only hurt if I touched it but now it throbs all the time. 7. PREGNANCY: "Is there any chance you are pregnant?" "When was your last menstrual period?"     Not asked  Protocols used: Arm Pain-A-AH

## 2021-07-26 DIAGNOSIS — S40022S Contusion of left upper arm, sequela: Secondary | ICD-10-CM | POA: Diagnosis not present

## 2021-07-26 DIAGNOSIS — R200 Anesthesia of skin: Secondary | ICD-10-CM | POA: Diagnosis not present

## 2021-07-26 DIAGNOSIS — Z23 Encounter for immunization: Secondary | ICD-10-CM | POA: Diagnosis not present

## 2021-07-26 DIAGNOSIS — M79602 Pain in left arm: Secondary | ICD-10-CM | POA: Diagnosis not present

## 2021-08-02 DIAGNOSIS — Z6828 Body mass index (BMI) 28.0-28.9, adult: Secondary | ICD-10-CM | POA: Diagnosis not present

## 2021-08-02 DIAGNOSIS — R635 Abnormal weight gain: Secondary | ICD-10-CM | POA: Diagnosis not present

## 2021-08-02 DIAGNOSIS — Z975 Presence of (intrauterine) contraceptive device: Secondary | ICD-10-CM | POA: Diagnosis not present

## 2021-08-28 DIAGNOSIS — Z30431 Encounter for routine checking of intrauterine contraceptive device: Secondary | ICD-10-CM | POA: Diagnosis not present

## 2021-08-30 ENCOUNTER — Emergency Department (HOSPITAL_BASED_OUTPATIENT_CLINIC_OR_DEPARTMENT_OTHER): Payer: Federal, State, Local not specified - PPO

## 2021-08-30 ENCOUNTER — Other Ambulatory Visit: Payer: Self-pay

## 2021-08-30 ENCOUNTER — Encounter (HOSPITAL_BASED_OUTPATIENT_CLINIC_OR_DEPARTMENT_OTHER): Payer: Self-pay | Admitting: Emergency Medicine

## 2021-08-30 ENCOUNTER — Emergency Department (HOSPITAL_BASED_OUTPATIENT_CLINIC_OR_DEPARTMENT_OTHER)
Admission: EM | Admit: 2021-08-30 | Discharge: 2021-08-30 | Disposition: A | Discharge status: Home / Self Care | Payer: Federal, State, Local not specified - PPO | Attending: Emergency Medicine | Admitting: Emergency Medicine

## 2021-08-30 DIAGNOSIS — R1033 Periumbilical pain: Secondary | ICD-10-CM | POA: Diagnosis not present

## 2021-08-30 DIAGNOSIS — R110 Nausea: Secondary | ICD-10-CM | POA: Diagnosis not present

## 2021-08-30 DIAGNOSIS — J45909 Unspecified asthma, uncomplicated: Secondary | ICD-10-CM | POA: Insufficient documentation

## 2021-08-30 DIAGNOSIS — N9489 Other specified conditions associated with female genital organs and menstrual cycle: Secondary | ICD-10-CM | POA: Diagnosis not present

## 2021-08-30 DIAGNOSIS — R109 Unspecified abdominal pain: Secondary | ICD-10-CM | POA: Diagnosis not present

## 2021-08-30 DIAGNOSIS — N939 Abnormal uterine and vaginal bleeding, unspecified: Secondary | ICD-10-CM | POA: Diagnosis not present

## 2021-08-30 LAB — CBC
HCT: 46.4 % — ABNORMAL HIGH (ref 36.0–46.0)
Hemoglobin: 15.5 g/dL — ABNORMAL HIGH (ref 12.0–15.0)
MCH: 31 pg (ref 26.0–34.0)
MCHC: 33.4 g/dL (ref 30.0–36.0)
MCV: 92.8 fL (ref 80.0–100.0)
Platelets: 336 10*3/uL (ref 150–400)
RBC: 5 MIL/uL (ref 3.87–5.11)
RDW: 12.5 % (ref 11.5–15.5)
WBC: 10.1 10*3/uL (ref 4.0–10.5)
nRBC: 0 % (ref 0.0–0.2)

## 2021-08-30 LAB — URINALYSIS, ROUTINE W REFLEX MICROSCOPIC
Bilirubin Urine: NEGATIVE
Glucose, UA: NEGATIVE mg/dL
Ketones, ur: NEGATIVE mg/dL
Leukocytes,Ua: NEGATIVE
Nitrite: NEGATIVE
Protein, ur: NEGATIVE mg/dL
Specific Gravity, Urine: 1.005 (ref 1.005–1.030)
pH: 6.5 (ref 5.0–8.0)

## 2021-08-30 LAB — COMPREHENSIVE METABOLIC PANEL
ALT: 9 U/L (ref 0–44)
AST: 12 U/L — ABNORMAL LOW (ref 15–41)
Albumin: 4.7 g/dL (ref 3.5–5.0)
Alkaline Phosphatase: 55 U/L (ref 38–126)
Anion gap: 7 (ref 5–15)
BUN: 6 mg/dL (ref 6–20)
CO2: 26 mmol/L (ref 22–32)
Calcium: 9.6 mg/dL (ref 8.9–10.3)
Chloride: 102 mmol/L (ref 98–111)
Creatinine, Ser: 0.72 mg/dL (ref 0.44–1.00)
GFR, Estimated: >60 mL/min (ref >60)
Glucose, Bld: 90 mg/dL (ref 70–99)
Potassium: 3.9 mmol/L (ref 3.5–5.1)
Sodium: 135 mmol/L (ref 135–145)
Total Bilirubin: 1.3 mg/dL — ABNORMAL HIGH (ref 0.3–1.2)
Total Protein: 7.7 g/dL (ref 6.5–8.1)

## 2021-08-30 LAB — LIPASE, BLOOD: Lipase: 19 U/L (ref 11–51)

## 2021-08-30 LAB — HCG, SERUM, QUALITATIVE: Preg, Serum: NEGATIVE

## 2021-08-30 MED ORDER — DICYCLOMINE HCL 20 MG PO TABS: 20.0000 mg | ORAL_TABLET | Freq: Three times a day (TID) | ORAL | 0 refills | Status: AC | PRN

## 2021-08-30 MED ORDER — ONDANSETRON 4 MG PO TBDP: 4.0000 mg | ORAL_TABLET | Freq: Three times a day (TID) | ORAL | 0 refills | Status: DC | PRN

## 2021-08-30 MED ORDER — ONDANSETRON HCL 4 MG/2ML IJ SOLN
4.0000 mg | Freq: Once | INTRAMUSCULAR | Status: AC
  Administered 2021-08-30: 4 mg via INTRAVENOUS
  Filled 2021-08-30: qty 2

## 2021-08-30 MED ORDER — MORPHINE SULFATE (PF) 4 MG/ML IV SOLN
4.0000 mg | Freq: Once | INTRAVENOUS | Status: AC
  Administered 2021-08-30: 4 mg via INTRAVENOUS
  Filled 2021-08-30: qty 1

## 2021-08-30 MED ORDER — IOHEXOL 300 MG/ML  SOLN
80.0000 mL | Freq: Once | INTRAMUSCULAR | Status: AC | PRN
  Administered 2021-08-30: 80 mL via INTRAVENOUS

## 2021-08-30 MED ORDER — SODIUM CHLORIDE 0.9 % IV BOLUS
500.0000 mL | Freq: Once | INTRAVENOUS | Status: AC
  Administered 2021-08-30: 500 mL via INTRAVENOUS

## 2021-08-30 MED ORDER — LOPERAMIDE HCL 2 MG PO CAPS: 2.0000 mg | ORAL_CAPSULE | Freq: Four times a day (QID) | ORAL | 0 refills | Status: DC | PRN

## 2021-08-30 NOTE — ED Triage Notes (Signed)
Pt arrives to ED with c/o abdominal pain. This started yesterday and is located suprapubic/umbilical. The pain is intermittent and described as pressure/stabbing. Alleviating factors include laying down. Standing worsens the pain. Last BM yesterday. Associated symptoms include nausea. Daughter had COVID last week.

## 2021-08-30 NOTE — Discharge Instructions (Signed)
Seen in the emergency room today with abdominal pain.  You have some enteritis type changes on your CT scan showing some inflammation in the loops of small bowel in the center of your belly.  This can be caused by a virus or other inflammatory process.  I expect that this will resolve on its own.  You will need drink plenty of fluids.  I have called in medications to help with symptoms.  Please follow closely with your primary care doctor.  If this continues you may require referral to a GI doctor but I expect this will resolve for you.  If you develop any suddenly worsening symptoms you should return to the emergency department for reevaluation.

## 2021-08-30 NOTE — ED Notes (Signed)
Patient verbalizes understanding of discharge instructions. Opportunity for questioning and answers were provided. Patient discharged from ED.  °

## 2021-08-30 NOTE — ED Notes (Signed)
Patient transported to CT 

## 2021-08-30 NOTE — ED Notes (Signed)
Pt states that she feels much better and the pain medication is effective.

## 2021-08-30 NOTE — ED Provider Notes (Signed)
Emergency Department Provider Note   I have reviewed the triage vital signs and the nursing notes.   HISTORY  Chief Complaint Abdominal Pain   HPI Sheila Graham is a 41 y.o. female with PMH of asthma presents emergency department for evaluation of periumbilical abdominal pain.  Symptoms began in yesterday.  She has had an intermittent stabbing/pressure type discomfort in the mid abdomen which is worse with standing and improved with lying flat.  She has nausea without vomiting.  No diarrhea or constipation.  No similar pain like this in the past.  She notes prior history of kidney stones as well as biliary colic but states this does not feel the same.  She is not having any vaginal discharge.  She did have an IUD replaced and after the procedure had some intermittent vaginal spotting but notes she was evaluated by her GYN, including pelvic exam, and that the IUD was thought to be in the proper location.  No worsening symptoms since that visit.     Past Medical History:  Diagnosis Date   Asthma    Esophageal web     Review of Systems  Constitutional: No fever/chills Cardiovascular: Denies chest pain. Respiratory: Denies shortness of breath. Gastrointestinal: Positive periumbilical abdominal pain.  Positive nausea, no vomiting.  No diarrhea.  No constipation. Genitourinary: Negative for dysuria. Musculoskeletal: Negative for back pain. Skin: Negative for rash. Neurological: Negative for headaches.   ____________________________________________   PHYSICAL EXAM:  VITAL SIGNS: ED Triage Vitals  Enc Vitals Group     BP 08/30/21 0826 (!) 126/96     Pulse Rate 08/30/21 0826 81     Resp 08/30/21 0826 16     Temp 08/30/21 0826 99 F (37.2 C)     Temp Source 08/30/21 0826 Oral     SpO2 08/30/21 0826 100 %     Weight 08/30/21 0822 165 lb (74.8 kg)     Height 08/30/21 0822 5\' 5"  (1.651 m)   Constitutional: Alert and oriented. Well appearing and in no acute  distress. Eyes: Conjunctivae are normal.  Head: Atraumatic. Nose: No congestion/rhinnorhea. Mouth/Throat: Mucous membranes are moist.  Neck: No stridor.  Cardiovascular: Normal rate, regular rhythm. Good peripheral circulation. Grossly normal heart sounds.   Respiratory: Normal respiratory effort.  No retractions. Lungs CTAB. Gastrointestinal: Soft with periumbilical and some RUQ tenderness. No peritonitis. Non-tender in the lower abdomen. No distention.  Musculoskeletal: No gross deformities of extremities. Neurologic:  Normal speech and language.  Skin:  Skin is warm, dry and intact. No rash noted.   ____________________________________________   LABS (all labs ordered are listed, but only abnormal results are displayed)  Labs Reviewed  COMPREHENSIVE METABOLIC PANEL - Abnormal; Notable for the following components:      Result Value   AST 12 (*)    Total Bilirubin 1.3 (*)    All other components within normal limits  CBC - Abnormal; Notable for the following components:   Hemoglobin 15.5 (*)    HCT 46.4 (*)    All other components within normal limits  URINALYSIS, ROUTINE W REFLEX MICROSCOPIC - Abnormal; Notable for the following components:   Hgb urine dipstick TRACE (*)    Bacteria, UA RARE (*)    All other components within normal limits  LIPASE, BLOOD  HCG, SERUM, QUALITATIVE   ____________________________________________  RADIOLOGY  CT ABDOMEN PELVIS W CONTRAST  Result Date: 08/30/2021 CLINICAL DATA:  Abdominal pain EXAM: CT ABDOMEN AND PELVIS WITH CONTRAST TECHNIQUE: Multidetector CT imaging of  the abdomen and pelvis was performed using the standard protocol following bolus administration of intravenous contrast. RADIATION DOSE REDUCTION: This exam was performed according to the departmental dose-optimization program which includes automated exposure control, adjustment of the mA and/or kV according to patient size and/or use of iterative reconstruction technique.  CONTRAST:  43mL OMNIPAQUE IOHEXOL 300 MG/ML  SOLN COMPARISON:  CT abdomen/pelvis 05/28/2018 FINDINGS: Lower chest: The lung bases are clear. The imaged heart is unremarkable. Hepatobiliary: A subcentimeter hypodense lesion in the left hepatic lobe is unchanged, likely a small cyst. Hypodensity along the falciform ligament likely reflects focal fatty infiltration. There are no other focal lesions. The gallbladder is unremarkable. There is no biliary ductal dilatation. Pancreas: Unremarkable. Spleen: Unremarkable. Adrenals/Urinary Tract: The adrenals are unremarkable. There is a 1.2 cm cyst in the left kidney with layering calcium likely reflecting milk of calcium within a cyst. There are no other focal lesions or calcifications. There is no hydronephrosis or hydroureter. The bladder is unremarkable. Stomach/Bowel: The stomach is unremarkable. There is no evidence of bowel obstruction. There is mild mucosal hyperemia of fluid-filled loops of small bowel in the lower mid abdomen. There is no other abnormal bowel wall thickening or inflammatory change. The appendix is normal. Vascular/Lymphatic: The abdominal aorta is normal in course and caliber. The major branch vessels are patent. The main portal and splenic veins are patent. There is no abdominal or pelvic lymphadenopathy. Reproductive: An IUD is in place within the uterus. There are bilateral adnexal cysts measuring up to 1.6 cm on the right, for which no specific follow-up is required. There is no suspicious adnexal mass. Other: There is small volume free fluid in the pelvis. There is no ascites in the upper abdomen. There is no free intraperitoneal air. The patient is status post umbilical hernia repair without evidence of recurrence. Musculoskeletal: There is no acute osseous abnormality or aggressive osseous lesion. IMPRESSION: 1. Mild mucosal hyperemia of some loops of fluid-filled small bowel in the mid abdomen may reflect mild infectious or inflammatory  enteritis. 2. Small volume free fluid in the pelvis may be physiologic or reactive. 3. Status post umbilical hernia repair without evidence of recurrence. 4. Calcification in the left kidney is favored to reflect milk of calcium within a cyst. Electronically Signed   By: Valetta Mole M.D.   On: 08/30/2021 09:49    ____________________________________________   PROCEDURES  Procedure(s) performed:   Procedures  None ____________________________________________   INITIAL IMPRESSION / ASSESSMENT AND PLAN / ED COURSE  Pertinent labs & imaging results that were available during my care of the patient were reviewed by me and considered in my medical decision making (see chart for details).   This patient is Presenting for Evaluation of abdominal pain, which does require a range of treatment options, and is a complaint that involves a high risk of morbidity and mortality.  The Differential Diagnoses includes but is not exclusive to ectopic pregnancy, ovarian cyst, ovarian torsion, acute appendicitis, urinary tract infection, endometriosis, bowel obstruction, hernia, colitis, renal colic, gastroenteritis, volvulus etc.   Critical Interventions- IVF, IV pain/nausea medication and CT imaging w/ labs.   Medications  sodium chloride 0.9 % bolus 500 mL (0 mLs Intravenous Stopped 08/30/21 1025)  morphine 4 MG/ML injection 4 mg (4 mg Intravenous Given 08/30/21 0910)  ondansetron (ZOFRAN) injection 4 mg (4 mg Intravenous Given 08/30/21 0907)  iohexol (OMNIPAQUE) 300 MG/ML solution 80 mL (80 mLs Intravenous Contrast Given 08/30/21 0928)    Reassessment after intervention:  patient  with improved abdominal pain symptoms and continued stable vital signs.   I did obtain Additional Historical Information from patient's husband at bedside who confirms symptoms and timeline.  I decided to review pertinent External Data, and in summary patient's last ED visit was 07/25/21 for an unrelated complaint.    Clinical Laboratory Tests Ordered, included CBC with no leukocytosis or anemia. CMP, UA, and Lipase.   Radiologic Tests Ordered, included CT abdomen/pelvis. I independently interpreted the images and agree with radiology interpretation.   Cardiac Monitor Tracing which shows NSR.   Social Determinants of Health Risk patient is a non-smoker and denies drug use.    Medical Decision Making: Summary:  Patient presents emergency department with periumbilical and right upper quadrant abdominal discomfort which began suddenly without clear provocation.  She has mild tenderness on exam but no peritonitis.  No specific new GU symptoms.  No lower abdominal tenderness.  My overall suspicion for ovarian torsion, PID, TOA are very low. Plan for CT imaging given several areas of pain and patient appearing somewhat uncomfortable on my initial evaluation.   Reevaluation with update and discussion with patient. CT imaging discussed. Pain symptoms reduced. Plan for symptom mgmt at home with close PCP follow up.   Disposition: discharge  ____________________________________________  FINAL CLINICAL IMPRESSION(S) / ED DIAGNOSES  Final diagnoses:  Periumbilical abdominal pain  Nausea     NEW OUTPATIENT MEDICATIONS STARTED DURING THIS VISIT:  Discharge Medication List as of 08/30/2021 10:17 AM     START taking these medications   Details  dicyclomine (BENTYL) 20 MG tablet Take 1 tablet (20 mg total) by mouth 3 (three) times daily as needed for spasms., Starting Thu 08/30/2021, Normal    loperamide (IMODIUM) 2 MG capsule Take 1 capsule (2 mg total) by mouth 4 (four) times daily as needed for diarrhea or loose stools., Starting Thu 08/30/2021, Normal        Note:  This document was prepared using Dragon voice recognition software and may include unintentional dictation errors.  Nanda Quinton, MD, [facility] Emergency [facility]    Jaselynn Tamas, [facility], MD 08/31/21 [phone]

## 2021-12-17 ENCOUNTER — Other Ambulatory Visit: Payer: Self-pay

## 2021-12-17 ENCOUNTER — Emergency Department (HOSPITAL_BASED_OUTPATIENT_CLINIC_OR_DEPARTMENT_OTHER): Payer: Federal, State, Local not specified - PPO | Admitting: Radiology

## 2021-12-17 ENCOUNTER — Encounter (HOSPITAL_BASED_OUTPATIENT_CLINIC_OR_DEPARTMENT_OTHER): Payer: Self-pay

## 2021-12-17 ENCOUNTER — Emergency Department (HOSPITAL_BASED_OUTPATIENT_CLINIC_OR_DEPARTMENT_OTHER)
Admission: EM | Admit: 2021-12-17 | Discharge: 2021-12-18 | Disposition: A | Discharge status: Home / Self Care | Payer: Federal, State, Local not specified - PPO | Attending: Emergency Medicine | Admitting: Emergency Medicine

## 2021-12-17 DIAGNOSIS — R410 Disorientation, unspecified: Secondary | ICD-10-CM | POA: Insufficient documentation

## 2021-12-17 DIAGNOSIS — T59811A Toxic effect of smoke, accidental (unintentional), initial encounter: Secondary | ICD-10-CM | POA: Insufficient documentation
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
## 2021-12-18 NOTE — ED Notes (Signed)
Patient verbalizes understanding of discharge instructions. Opportunity for questioning and answers were provided. Armband removed by staff, pt discharged from ED to home via POV with significant other ?

## 2022-06-03 DIAGNOSIS — Z13220 Encounter for screening for lipoid disorders: Secondary | ICD-10-CM | POA: Diagnosis not present

## 2022-06-03 DIAGNOSIS — K200 Eosinophilic esophagitis: Secondary | ICD-10-CM | POA: Diagnosis not present

## 2022-06-03 DIAGNOSIS — R109 Unspecified abdominal pain: Secondary | ICD-10-CM | POA: Diagnosis not present

## 2022-06-03 DIAGNOSIS — R413 Other amnesia: Secondary | ICD-10-CM | POA: Diagnosis not present

## 2022-06-03 DIAGNOSIS — K5900 Constipation, unspecified: Secondary | ICD-10-CM | POA: Diagnosis not present

## 2022-06-03 DIAGNOSIS — Z23 Encounter for immunization: Secondary | ICD-10-CM | POA: Diagnosis not present

## 2022-06-03 DIAGNOSIS — R5383 Other fatigue: Secondary | ICD-10-CM | POA: Diagnosis not present

## 2022-06-03 DIAGNOSIS — G479 Sleep disorder, unspecified: Secondary | ICD-10-CM | POA: Diagnosis not present

## 2022-07-08 IMAGING — US US EXTREM  UP VENOUS*L*
1 series · 13 of 24 positions shown · non-contrast
Comparison: None.

CLINICAL DATA: Left upper extremity pain and edema.



[Series 1: us venous img upper uni left (dvt) · portal-venous · 13 of 34 slices shown]
[im 1/34]
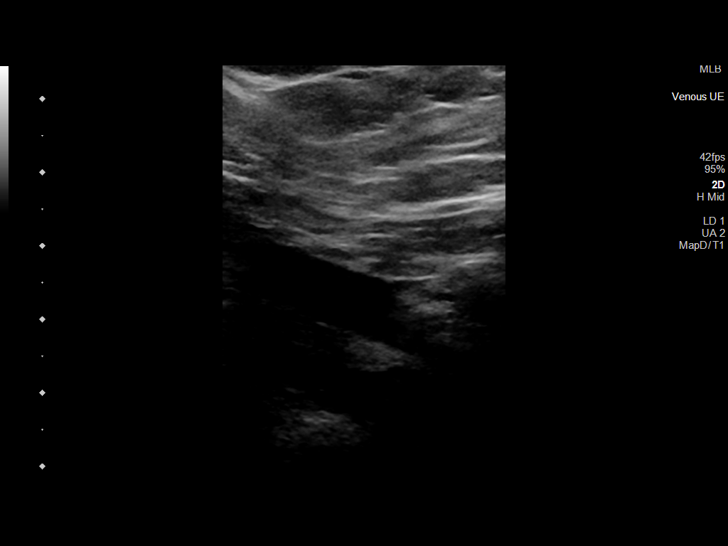
[im 3/34]
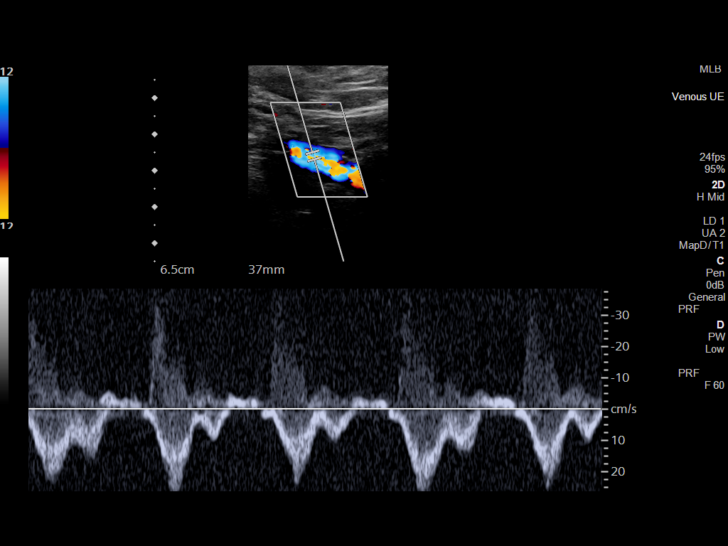
[im 6/34]
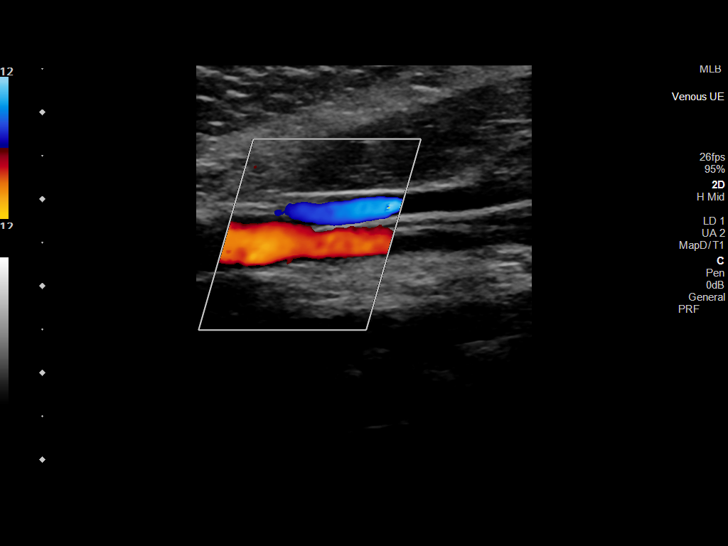
[im 9/34]
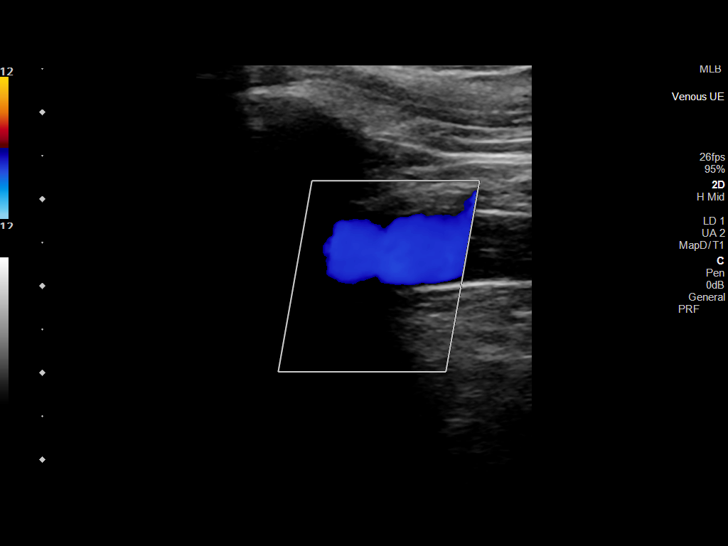
[im 12/34]
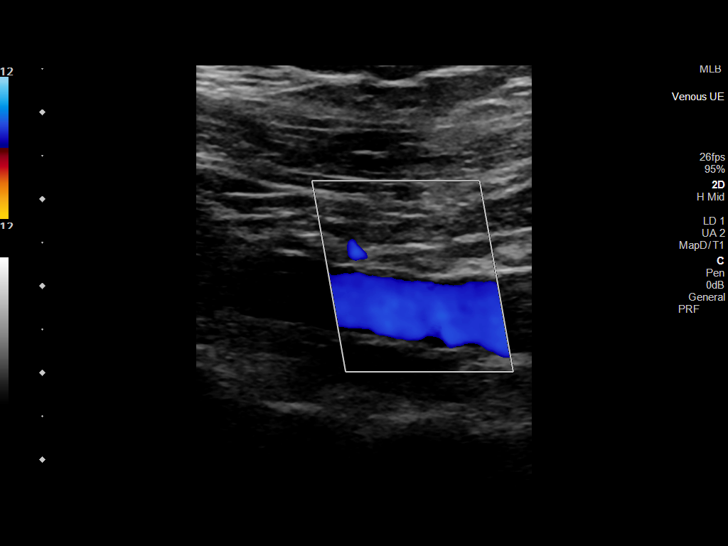
[im 15/34]
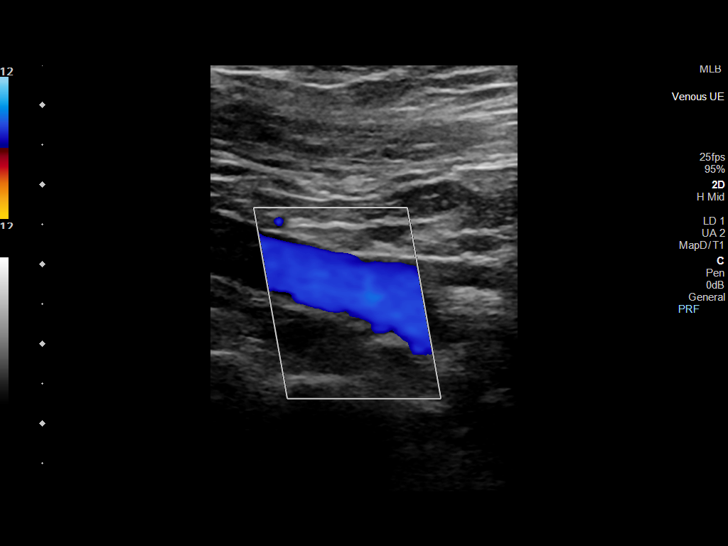
[im 18/34]
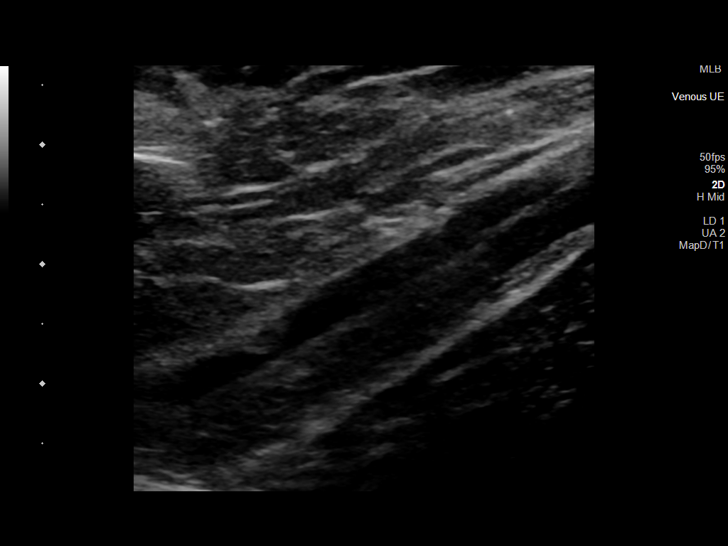
[im 19/34]
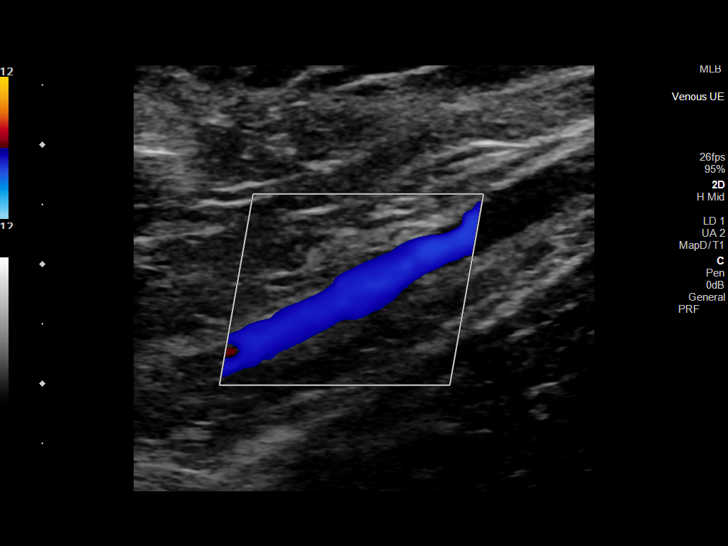
[im 22/34]
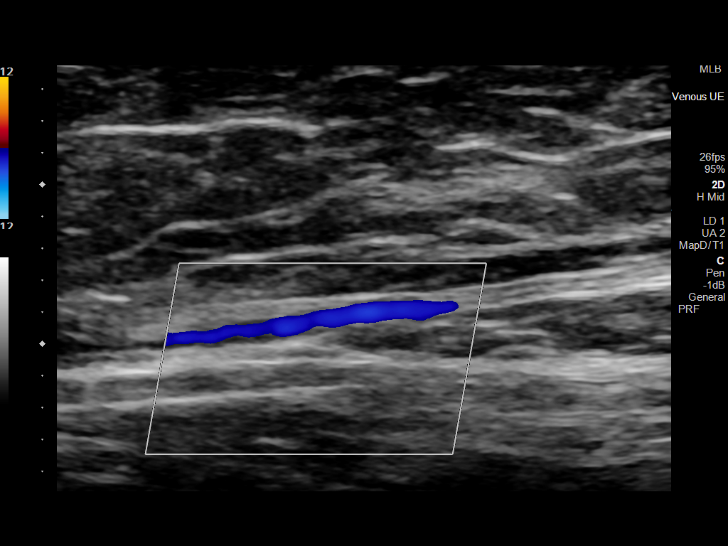
[im 25/34]
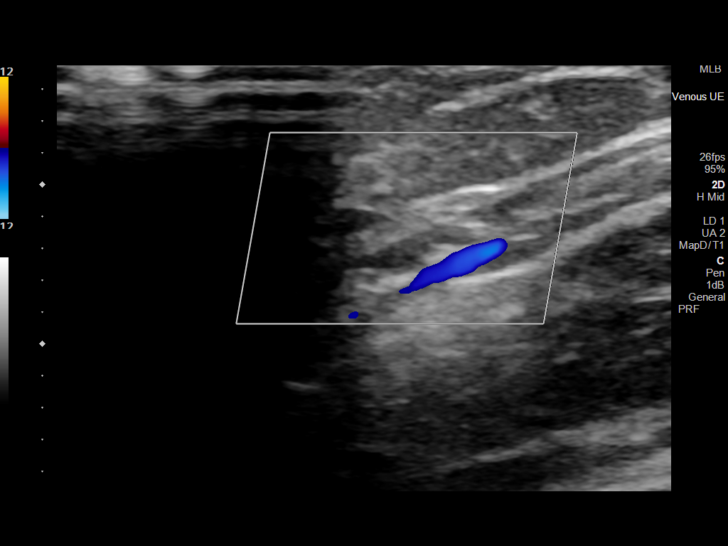
[im 28/34]
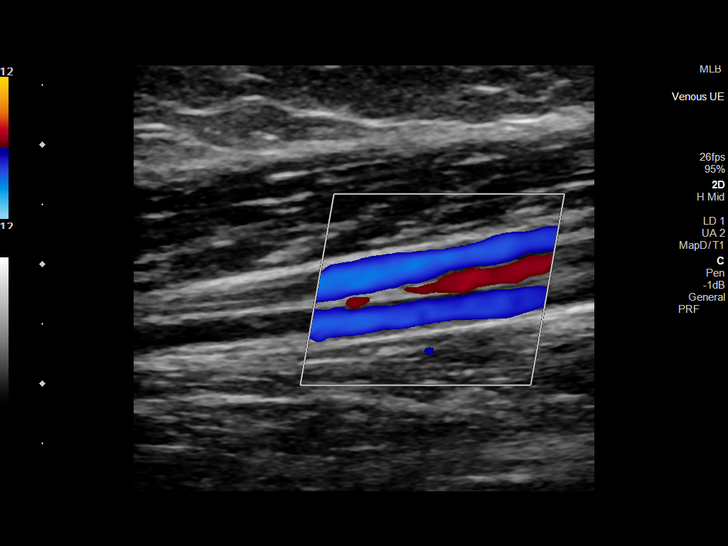
[im 31/34]
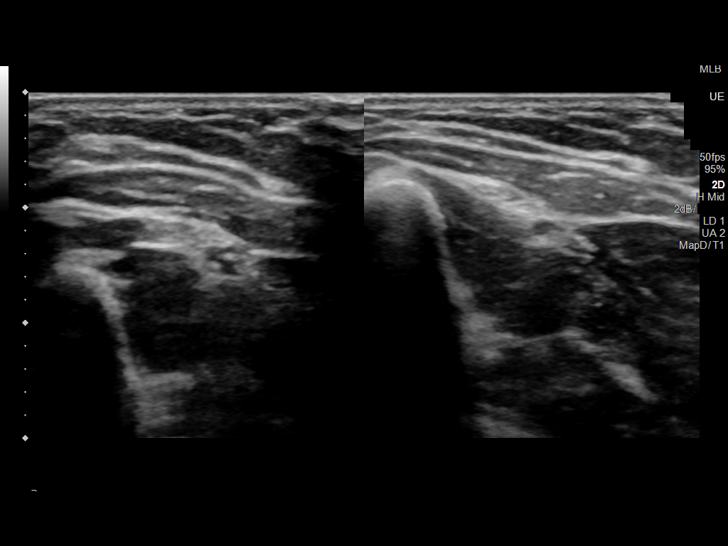
[im 34/34]
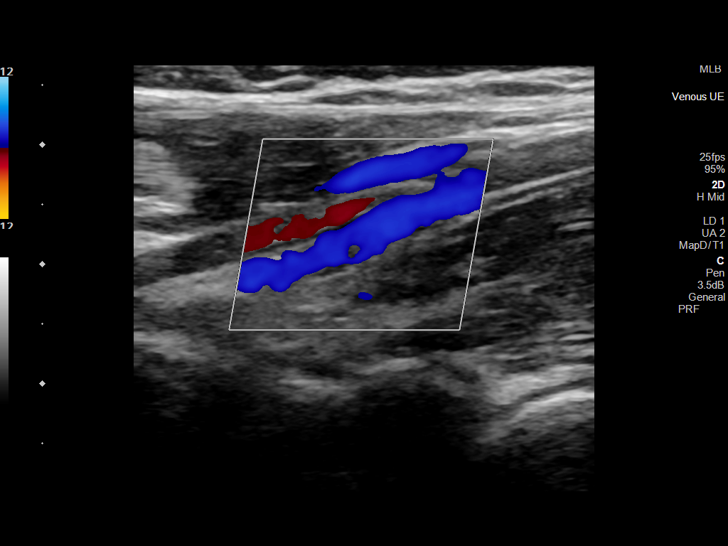

[13 of 24 positions shown; findings below may reference images not displayed]

FINDINGS: Contralateral Subclavian Vein: Respiratory phasicity is normal and
symmetric with the symptomatic side. No evidence of thrombus. Normal
compressibility.

Internal Jugular Vein: No evidence of thrombus. Normal
compressibility, respiratory phasicity and response to augmentation.

Subclavian Vein: No evidence of thrombus. Normal compressibility,
respiratory phasicity and response to augmentation.

Axillary Vein: No evidence of thrombus. Normal compressibility,
respiratory phasicity and response to augmentation.

Cephalic Vein: No evidence of thrombus. Normal compressibility,
respiratory phasicity and response to augmentation.

Basilic Vein: No evidence of thrombus. Normal compressibility,
respiratory phasicity and response to augmentation.

Brachial Veins: No evidence of thrombus. Normal compressibility,
respiratory phasicity and response to augmentation.

Radial Veins: No evidence of thrombus. Normal compressibility,
respiratory phasicity and response to augmentation.

Ulnar Veins: No evidence of thrombus. Normal compressibility,
respiratory phasicity and response to augmentation.

Venous Reflux:  None visualized.

Other Findings: No evidence of superficial thrombophlebitis or
abnormal fluid collection.
IMPRESSION: No evidence of DVT within the left upper extremity.

## 2022-07-28 ENCOUNTER — Emergency Department (HOSPITAL_BASED_OUTPATIENT_CLINIC_OR_DEPARTMENT_OTHER)
Admission: EM | Admit: 2022-07-28 | Discharge: 2022-07-28 | Disposition: A | Discharge status: Home / Self Care | Payer: Federal, State, Local not specified - PPO | Attending: Emergency Medicine | Admitting: Emergency Medicine

## 2022-07-28 ENCOUNTER — Encounter (HOSPITAL_BASED_OUTPATIENT_CLINIC_OR_DEPARTMENT_OTHER): Payer: Self-pay

## 2022-07-28 ENCOUNTER — Emergency Department (HOSPITAL_BASED_OUTPATIENT_CLINIC_OR_DEPARTMENT_OTHER): Payer: Federal, State, Local not specified - PPO | Admitting: Radiology

## 2022-07-28 ENCOUNTER — Emergency Department (HOSPITAL_BASED_OUTPATIENT_CLINIC_OR_DEPARTMENT_OTHER): Payer: Federal, State, Local not specified - PPO

## 2022-07-28 DIAGNOSIS — S99911A Unspecified injury of right ankle, initial encounter: Secondary | ICD-10-CM | POA: Diagnosis not present

## 2022-07-28 DIAGNOSIS — M79661 Pain in right lower leg: Secondary | ICD-10-CM | POA: Diagnosis not present

## 2022-07-28 DIAGNOSIS — Z043 Encounter for examination and observation following other accident: Secondary | ICD-10-CM | POA: Diagnosis not present

## 2022-07-28 DIAGNOSIS — M7731 Calcaneal spur, right foot: Secondary | ICD-10-CM | POA: Diagnosis not present

## 2022-07-28 DIAGNOSIS — W19XXXA Unspecified fall, initial encounter: Secondary | ICD-10-CM

## 2022-07-28 DIAGNOSIS — M79671 Pain in right foot: Secondary | ICD-10-CM | POA: Insufficient documentation

## 2022-07-28 DIAGNOSIS — M25571 Pain in right ankle and joints of right foot: Secondary | ICD-10-CM | POA: Diagnosis not present

## 2022-07-28 DIAGNOSIS — W108XXA Fall (on) (from) other stairs and steps, initial encounter: Secondary | ICD-10-CM | POA: Insufficient documentation

## 2022-07-28 DIAGNOSIS — S8991XA Unspecified injury of right lower leg, initial encounter: Secondary | ICD-10-CM | POA: Insufficient documentation

## 2022-07-28 MED ORDER — OXYCODONE-ACETAMINOPHEN 5-325 MG PO TABS
1.0000 | ORAL_TABLET | ORAL | Status: DC | PRN
  Administered 2022-07-28: 1 via ORAL
  Filled 2022-07-28: qty 1

## 2022-07-28 MED ORDER — ONDANSETRON 4 MG PO TBDP
4.0000 mg | ORAL_TABLET | Freq: Once | ORAL | Status: AC
Start: 2022-07-28 — End: 2022-07-28
  Administered 2022-07-28: 4 mg via ORAL
  Filled 2022-07-28: qty 1

## 2022-07-28 MED ORDER — OXYCODONE-ACETAMINOPHEN 5-325 MG PO TABS: 1.0000 | ORAL_TABLET | Freq: Once | ORAL | Status: DC

## 2022-07-28 MED ORDER — OXYCODONE-ACETAMINOPHEN 5-325 MG PO TABS
1.0000 | ORAL_TABLET | Freq: Four times a day (QID) | ORAL | 0 refills | Status: DC | PRN
Start: 2022-07-28 — End: 2023-08-11

## 2022-07-28 MED ORDER — OXYCODONE-ACETAMINOPHEN 5-325 MG PO TABS: 1.0000 | ORAL_TABLET | Freq: Four times a day (QID) | ORAL | 0 refills | Status: DC | PRN

## 2022-07-28 NOTE — ED Provider Notes (Signed)
[facility] EMERGENCY DEPT Provider Note   CSN: [id number] Arrival date & time: 07/28/22  0741     History  Chief Complaint  Patient presents with   Fall   Ankle Pain    Sheila Graham is a 41 y.o. female.  She is here with severe right foot ankle leg pain after falling down a few stairs this morning.  She said she is unable to move her toes and has some tingling in the foot.  Denies other injuries.  Unable to ambulate.  No loss of consciousness no blood thinners.  The history is provided by the patient.  Fall This is a new problem. Pertinent negatives include no chest pain, no abdominal pain, no headaches and no shortness of breath. The symptoms are aggravated by bending and twisting. Nothing relieves the symptoms. She has tried rest for the symptoms. The treatment provided no relief.  Ankle Pain Location:  Ankle Injury: yes   Mechanism of injury: fall   Ankle location:  R ankle Pain details:    Radiates to:  R leg   Severity:  Severe   Onset quality:  Sudden   Timing:  Constant   Progression:  Unchanged Chronicity:  New Relieved by:  None tried Worsened by:  Extension and rotation Ineffective treatments:  Rest Associated symptoms: decreased ROM, muscle weakness, numbness and tingling   Associated symptoms: no back pain, no fever and no neck pain        Home Medications Prior to Admission medications   Medication Sig Start Date End Date Taking? Authorizing Provider  clotrimazole (LOTRIMIN) 1 % cream Apply to affected area 2 times daily Patient not taking: Reported on 07/25/2021 12/11/17   Graciella Freer A, PA-C  cyclobenzaprine (FLEXERIL) 5 MG tablet Take 1 tablet (5 mg total) by mouth 3 (three) times daily as needed for muscle spasms. Patient not taking: Reported on 07/25/2021 07/13/19   Milagros Loll, MD  dicyclomine (BENTYL) 20 MG tablet Take 1 tablet (20 mg total) by mouth 3 (three) times daily as needed for spasms. 08/30/21   Long, Arlyss Repress, MD   levonorgestrel [facility]) 19.5 MG IUD See admin instructions. iud 11/22/16   [provider]  lidocaine (XYLOCAINE) 2 % solution Use as directed 15 mLs in the mouth or throat as needed for mouth pain. Patient not taking: Reported on 07/25/2021 05/22/16   Garlon Hatchet, PA-C  loperamide (IMODIUM) 2 MG capsule Take 1 capsule (2 mg total) by mouth 4 (four) times daily as needed for diarrhea or loose stools. 08/30/21   Long, Arlyss Repress, MD  naproxen (NAPROSYN) 500 MG tablet Take 1 tablet (500 mg total) by mouth 2 (two) times daily. Patient not taking: Reported on 07/25/2021 05/28/18   Petrucelli, Samantha R, PA-C  ondansetron (ZOFRAN-ODT) 4 MG disintegrating tablet Take 1 tablet (4 mg total) by mouth every 8 (eight) hours as needed. 08/30/21   Long, Arlyss Repress, MD  pantoprazole (PROTONIX) 40 MG tablet Take 1 tablet (40 mg total) by mouth 2 (two) times daily. Patient not taking: Reported on 07/25/2021 04/08/16   Tomasita Crumble, MD  phentermine (ADIPEX-P) 37.5 MG tablet Take 37.5 mg by mouth daily. 07/05/21   [provider]  tiZANidine (ZANAFLEX) 4 MG tablet TAKE 1 TABLET (4 MG TOTAL) BY MOUTH AT BEDTIME. Patient not taking: Reported on 07/25/2021 06/28/19   Judi Saa, DO      Allergies    Patient has no known allergies.    Review of Systems  Review of Systems  Constitutional:  Negative for fever.  Respiratory:  Negative for shortness of breath.   Cardiovascular:  Negative for chest pain.  Gastrointestinal:  Negative for abdominal pain.  Musculoskeletal:  Positive for gait problem. Negative for back pain and neck pain.  Skin:  Negative for wound.  Neurological:  Positive for weakness and numbness. Negative for headaches.    Physical Exam Updated Vital Signs BP 111/76 (BP Location: Left Arm)   Pulse 80   Temp 98 F (36.7 C) (Oral)   Resp 18   SpO2 100%  Physical Exam Vitals and nursing note reviewed.  Constitutional:      General: She is not in acute distress.     Appearance: Normal appearance. She is well-developed.  HENT:     Head: Normocephalic and atraumatic.  Eyes:     Conjunctiva/sclera: Conjunctivae normal.  Cardiovascular:     Rate and Rhythm: Normal rate and regular rhythm.     Heart sounds: No murmur heard. Pulmonary:     Effort: Pulmonary effort is normal. No respiratory distress.     Breath sounds: Normal breath sounds.  Abdominal:     Palpations: Abdomen is soft.     Tenderness: There is no abdominal tenderness.  Musculoskeletal:        General: Tenderness present. No deformity.     Cervical back: Neck supple. No tenderness.     Comments: She is diffusely tender around her right ankle proximal foot.  Calf and Achilles tender although do not feel any obvious tissue defect and Achilles tendon appears intact.  Distal pulses intact.  She has limited range of motion and subjective decrease sensation to her foot.  Cap refill brisk.  No open wounds.  Hip and knee nontender.  Other extremities full range of motion without any pain or limitations.  Skin:    General: Skin is warm and dry.     Capillary Refill: Capillary refill takes less than 2 seconds.  Neurological:     General: No focal deficit present.     Mental Status: She is alert.     ED Results / Procedures / Treatments   Labs (all labs ordered are listed, but only abnormal results are displayed) Labs Reviewed - No data to display  EKG None  Radiology DG Tibia/Fibula Right  Result Date: 07/28/2022 CLINICAL DATA:  Fall, pain EXAM: RIGHT TIBIA AND FIBULA - 2 VIEW COMPARISON:  None Available. FINDINGS: There is no evidence of acute fracture.  Alignment is normal. IMPRESSION: No acute osseous abnormality. Electronically Signed   By: Caprice Renshaw M.D.   On: 07/28/2022 09:15   DG Foot Complete Right  Result Date: 07/28/2022 CLINICAL DATA:  Fall, pain EXAM: RIGHT FOOT COMPLETE - 3+ VIEW COMPARISON:  None Available. FINDINGS: There is no evidence of acute fracture. Alignment is  normal. There is no significant arthropathy. IMPRESSION: No evidence of acute fracture. Electronically Signed   By: Caprice Renshaw M.D.   On: 07/28/2022 09:13   DG Ankle Complete Right  Result Date: 07/28/2022 CLINICAL DATA:  Fall EXAM: RIGHT ANKLE - COMPLETE 3+ VIEW COMPARISON:  None Available. FINDINGS: There is no evidence of acute fracture. Alignment is normal. There is no significant arthropathy. Plantar calcaneal spur. IMPRESSION: No evidence of acute fracture. Electronically Signed   By: Caprice Renshaw M.D.   On: 07/28/2022 09:11    Procedures Procedures    Medications Ordered in ED Medications  oxyCODONE-acetaminophen (PERCOCET/ROXICET) 5-325 MG per tablet 1 tablet (1 tablet Oral  Given 07/28/22 0814)  ondansetron (ZOFRAN-ODT) disintegrating tablet 4 mg (4 mg Oral Given 07/28/22 7169)    ED Course/ Medical Decision Making/ A&P                           Medical Decision Making Amount and/or Complexity of Data Reviewed Radiology: ordered.  Risk Prescription drug management.   This patient complains of right leg injury after a fall; this involves an extensive number of treatment Options and is a complaint that carries with it a high risk of complications and morbidity. The differential includes fracture, dislocation, contusion, neuropraxia, compartment syndrome, spine injury, radiculopathy  I ordered medication oral pain medication and reviewed PMP when indicated. I ordered imaging studies which included x-rays of of right foot ankle tib-fib and I independently    visualized and interpreted imaging which showed no acute fracture or dislocation Additional history obtained from patient significant other Previous records obtained and in epic no recent admissions.  Cardiac monitoring reviewed, normal sinus rhythm Social determinants considered, no significant barriers Critical Interventions: None  After the interventions stated above, I reevaluated the patient and found patient to  have improving movement of her foot although still not back to baseline.  No clear etiology identified.  Will place in posterior short leg and crutches and recommended close follow-up with orthopedics.  Ice elevation. Admission and further testing considered, no indications for admission at this time.  Will need close follow-up with orthopedics due to neurologic deficit.  No clear cause of patient's symptoms.  Possible contusion and neuropraxia.  No dislocation or fracture.  Distal vascular exam intact.         Final Clinical Impression(s) / ED Diagnoses Final diagnoses:  Injury of right lower extremity, initial encounter  Fall, initial encounter    Rx / DC Orders ED Discharge Orders     None         Terrilee Files, MD 07/28/22 [phone]

## 2022-07-28 NOTE — ED Notes (Signed)
Doppler used, and picked up regular pulse in R. Foot.

## 2022-07-28 NOTE — Discharge Instructions (Signed)
You were seen in the emergency department for right lower leg and ankle pain after a fall.  Your x-rays did not show any definite fracture.  You were still unable to move your leg in a normal fashion.  You been placed in a splint please keep on clean and dry.  Ice and elevate.  Ibuprofen.  We are prescribing a short course of some pain medication.  Please contact orthopedics for close follow-up.  Return to the emergency department if any worsening or concerning symptoms

## 2022-07-28 NOTE — ED Notes (Signed)
Discharge instructions, follow up care with ortho, pain management and prescription reviewed and explained, pt verbalized understanding. Ice pack provided. Pt has good pulse, motor, and sensation in R foot on d/c. Pt practiced crutches in room prior to d/c. Pt had no further questions on d/c.

## 2022-07-28 NOTE — ED Triage Notes (Signed)
Pt presents POV from home, BIB WC by spouse.   Pt reports a mechanical fall this am, fell down 3 steps. Pt reports pain in her Right ankle radiating up into her knee. Pt also reports tingling to her toes, unable to move her foot in any direction

## 2022-07-31 DIAGNOSIS — M25571 Pain in right ankle and joints of right foot: Secondary | ICD-10-CM | POA: Diagnosis not present

## 2022-07-31 DIAGNOSIS — M25561 Pain in right knee: Secondary | ICD-10-CM | POA: Diagnosis not present

## 2022-08-02 DIAGNOSIS — M25571 Pain in right ankle and joints of right foot: Secondary | ICD-10-CM | POA: Diagnosis not present

## 2022-08-02 DIAGNOSIS — M25561 Pain in right knee: Secondary | ICD-10-CM | POA: Diagnosis not present

## 2022-08-07 DIAGNOSIS — M25561 Pain in right knee: Secondary | ICD-10-CM | POA: Diagnosis not present

## 2022-08-07 DIAGNOSIS — M25571 Pain in right ankle and joints of right foot: Secondary | ICD-10-CM | POA: Diagnosis not present

## 2022-08-09 DIAGNOSIS — K5900 Constipation, unspecified: Secondary | ICD-10-CM | POA: Diagnosis not present

## 2022-08-09 DIAGNOSIS — K200 Eosinophilic esophagitis: Secondary | ICD-10-CM | POA: Diagnosis not present

## 2022-09-04 DIAGNOSIS — M25561 Pain in right knee: Secondary | ICD-10-CM | POA: Diagnosis not present

## 2022-09-09 DIAGNOSIS — M25571 Pain in right ankle and joints of right foot: Secondary | ICD-10-CM | POA: Diagnosis not present

## 2022-09-09 DIAGNOSIS — M25561 Pain in right knee: Secondary | ICD-10-CM | POA: Diagnosis not present

## 2022-09-09 DIAGNOSIS — Z7409 Other reduced mobility: Secondary | ICD-10-CM | POA: Diagnosis not present

## 2022-09-09 DIAGNOSIS — W19XXXA Unspecified fall, initial encounter: Secondary | ICD-10-CM | POA: Diagnosis not present

## 2022-09-16 DIAGNOSIS — M25561 Pain in right knee: Secondary | ICD-10-CM | POA: Diagnosis not present

## 2022-09-16 DIAGNOSIS — W19XXXA Unspecified fall, initial encounter: Secondary | ICD-10-CM | POA: Diagnosis not present

## 2022-09-16 DIAGNOSIS — Z7409 Other reduced mobility: Secondary | ICD-10-CM | POA: Diagnosis not present

## 2022-09-16 DIAGNOSIS — M25571 Pain in right ankle and joints of right foot: Secondary | ICD-10-CM | POA: Diagnosis not present

## 2022-10-07 DIAGNOSIS — M25571 Pain in right ankle and joints of right foot: Secondary | ICD-10-CM | POA: Diagnosis not present

## 2022-10-07 DIAGNOSIS — M25561 Pain in right knee: Secondary | ICD-10-CM | POA: Diagnosis not present

## 2022-10-07 DIAGNOSIS — W19XXXA Unspecified fall, initial encounter: Secondary | ICD-10-CM | POA: Diagnosis not present

## 2022-10-09 DIAGNOSIS — S93491A Sprain of other ligament of right ankle, initial encounter: Secondary | ICD-10-CM | POA: Diagnosis not present

## 2022-10-10 DIAGNOSIS — Z7409 Other reduced mobility: Secondary | ICD-10-CM | POA: Diagnosis not present

## 2022-10-10 DIAGNOSIS — W19XXXA Unspecified fall, initial encounter: Secondary | ICD-10-CM | POA: Diagnosis not present

## 2022-10-10 DIAGNOSIS — M25571 Pain in right ankle and joints of right foot: Secondary | ICD-10-CM | POA: Diagnosis not present

## 2022-10-10 DIAGNOSIS — Z789 Other specified health status: Secondary | ICD-10-CM | POA: Diagnosis not present

## 2022-10-10 DIAGNOSIS — M25561 Pain in right knee: Secondary | ICD-10-CM | POA: Diagnosis not present

## 2022-10-14 DIAGNOSIS — K200 Eosinophilic esophagitis: Secondary | ICD-10-CM | POA: Diagnosis not present

## 2022-10-14 DIAGNOSIS — K449 Diaphragmatic hernia without obstruction or gangrene: Secondary | ICD-10-CM | POA: Diagnosis not present

## 2022-10-15 DIAGNOSIS — M25571 Pain in right ankle and joints of right foot: Secondary | ICD-10-CM | POA: Diagnosis not present

## 2022-10-15 DIAGNOSIS — Z789 Other specified health status: Secondary | ICD-10-CM | POA: Diagnosis not present

## 2022-10-15 DIAGNOSIS — M25561 Pain in right knee: Secondary | ICD-10-CM | POA: Diagnosis not present

## 2022-10-15 DIAGNOSIS — Z7409 Other reduced mobility: Secondary | ICD-10-CM | POA: Diagnosis not present

## 2022-10-15 DIAGNOSIS — W19XXXA Unspecified fall, initial encounter: Secondary | ICD-10-CM | POA: Diagnosis not present

## 2022-10-24 DIAGNOSIS — Z7409 Other reduced mobility: Secondary | ICD-10-CM | POA: Diagnosis not present

## 2022-10-24 DIAGNOSIS — M25561 Pain in right knee: Secondary | ICD-10-CM | POA: Diagnosis not present

## 2022-10-24 DIAGNOSIS — M25571 Pain in right ankle and joints of right foot: Secondary | ICD-10-CM | POA: Diagnosis not present

## 2022-10-24 DIAGNOSIS — Z789 Other specified health status: Secondary | ICD-10-CM | POA: Diagnosis not present

## 2022-10-28 DIAGNOSIS — Z789 Other specified health status: Secondary | ICD-10-CM | POA: Diagnosis not present

## 2022-10-28 DIAGNOSIS — M25561 Pain in right knee: Secondary | ICD-10-CM | POA: Diagnosis not present

## 2022-10-28 DIAGNOSIS — M25571 Pain in right ankle and joints of right foot: Secondary | ICD-10-CM | POA: Diagnosis not present

## 2022-10-28 DIAGNOSIS — Z7409 Other reduced mobility: Secondary | ICD-10-CM | POA: Diagnosis not present

## 2022-10-31 DIAGNOSIS — Z789 Other specified health status: Secondary | ICD-10-CM | POA: Diagnosis not present

## 2022-10-31 DIAGNOSIS — Z7409 Other reduced mobility: Secondary | ICD-10-CM | POA: Diagnosis not present

## 2022-10-31 DIAGNOSIS — M25571 Pain in right ankle and joints of right foot: Secondary | ICD-10-CM | POA: Diagnosis not present

## 2022-10-31 DIAGNOSIS — M25561 Pain in right knee: Secondary | ICD-10-CM | POA: Diagnosis not present

## 2022-11-13 DIAGNOSIS — W19XXXA Unspecified fall, initial encounter: Secondary | ICD-10-CM | POA: Diagnosis not present

## 2022-11-13 DIAGNOSIS — Z7409 Other reduced mobility: Secondary | ICD-10-CM | POA: Diagnosis not present

## 2022-11-13 DIAGNOSIS — M25561 Pain in right knee: Secondary | ICD-10-CM | POA: Diagnosis not present

## 2022-11-13 DIAGNOSIS — M25571 Pain in right ankle and joints of right foot: Secondary | ICD-10-CM | POA: Diagnosis not present

## 2022-11-15 DIAGNOSIS — M25561 Pain in right knee: Secondary | ICD-10-CM | POA: Diagnosis not present

## 2022-11-15 DIAGNOSIS — M25571 Pain in right ankle and joints of right foot: Secondary | ICD-10-CM | POA: Diagnosis not present

## 2022-11-15 DIAGNOSIS — W19XXXA Unspecified fall, initial encounter: Secondary | ICD-10-CM | POA: Diagnosis not present

## 2022-11-15 DIAGNOSIS — Z7409 Other reduced mobility: Secondary | ICD-10-CM | POA: Diagnosis not present

## 2022-11-17 DIAGNOSIS — N2889 Other specified disorders of kidney and ureter: Secondary | ICD-10-CM | POA: Diagnosis not present

## 2022-11-17 DIAGNOSIS — R195 Other fecal abnormalities: Secondary | ICD-10-CM | POA: Diagnosis not present

## 2022-11-17 DIAGNOSIS — K219 Gastro-esophageal reflux disease without esophagitis: Secondary | ICD-10-CM | POA: Diagnosis not present

## 2022-11-17 DIAGNOSIS — K921 Melena: Secondary | ICD-10-CM | POA: Diagnosis not present

## 2022-11-17 DIAGNOSIS — K7689 Other specified diseases of liver: Secondary | ICD-10-CM | POA: Diagnosis not present

## 2022-11-17 DIAGNOSIS — Z975 Presence of (intrauterine) contraceptive device: Secondary | ICD-10-CM | POA: Diagnosis not present

## 2022-11-17 DIAGNOSIS — Z888 Allergy status to other drugs, medicaments and biological substances status: Secondary | ICD-10-CM | POA: Diagnosis not present

## 2022-11-17 DIAGNOSIS — K625 Hemorrhage of anus and rectum: Secondary | ICD-10-CM | POA: Diagnosis not present

## 2022-11-17 DIAGNOSIS — Z79899 Other long term (current) drug therapy: Secondary | ICD-10-CM | POA: Diagnosis not present

## 2022-11-17 DIAGNOSIS — R1013 Epigastric pain: Secondary | ICD-10-CM | POA: Diagnosis not present

## 2022-11-20 DIAGNOSIS — S93491A Sprain of other ligament of right ankle, initial encounter: Secondary | ICD-10-CM | POA: Diagnosis not present

## 2022-11-21 DIAGNOSIS — M25561 Pain in right knee: Secondary | ICD-10-CM | POA: Diagnosis not present

## 2022-11-21 DIAGNOSIS — W19XXXA Unspecified fall, initial encounter: Secondary | ICD-10-CM | POA: Diagnosis not present

## 2022-11-21 DIAGNOSIS — Z7409 Other reduced mobility: Secondary | ICD-10-CM | POA: Diagnosis not present

## 2022-11-21 DIAGNOSIS — M25571 Pain in right ankle and joints of right foot: Secondary | ICD-10-CM | POA: Diagnosis not present

## 2022-11-22 DIAGNOSIS — S93491D Sprain of other ligament of right ankle, subsequent encounter: Secondary | ICD-10-CM | POA: Diagnosis not present

## 2022-11-27 DIAGNOSIS — M25561 Pain in right knee: Secondary | ICD-10-CM | POA: Diagnosis not present

## 2022-11-28 DIAGNOSIS — Y999 Unspecified external cause status: Secondary | ICD-10-CM | POA: Diagnosis not present

## 2022-11-28 DIAGNOSIS — X58XXXA Exposure to other specified factors, initial encounter: Secondary | ICD-10-CM | POA: Diagnosis not present

## 2022-11-28 DIAGNOSIS — S93431A Sprain of tibiofibular ligament of right ankle, initial encounter: Secondary | ICD-10-CM | POA: Diagnosis not present

## 2022-11-28 DIAGNOSIS — S82861A Displaced Maisonneuve's fracture of right leg, initial encounter for closed fracture: Secondary | ICD-10-CM | POA: Diagnosis not present

## 2022-11-28 DIAGNOSIS — G8918 Other acute postprocedural pain: Secondary | ICD-10-CM | POA: Diagnosis not present

## 2022-11-30 IMAGING — DX DG CHEST 2V
2 series · 2 of 2 positions shown · non-contrast
Comparison: None Available.

CLINICAL DATA: Shortness of breath

EXAM:
CHEST - 2 VIEW

[chest pa]
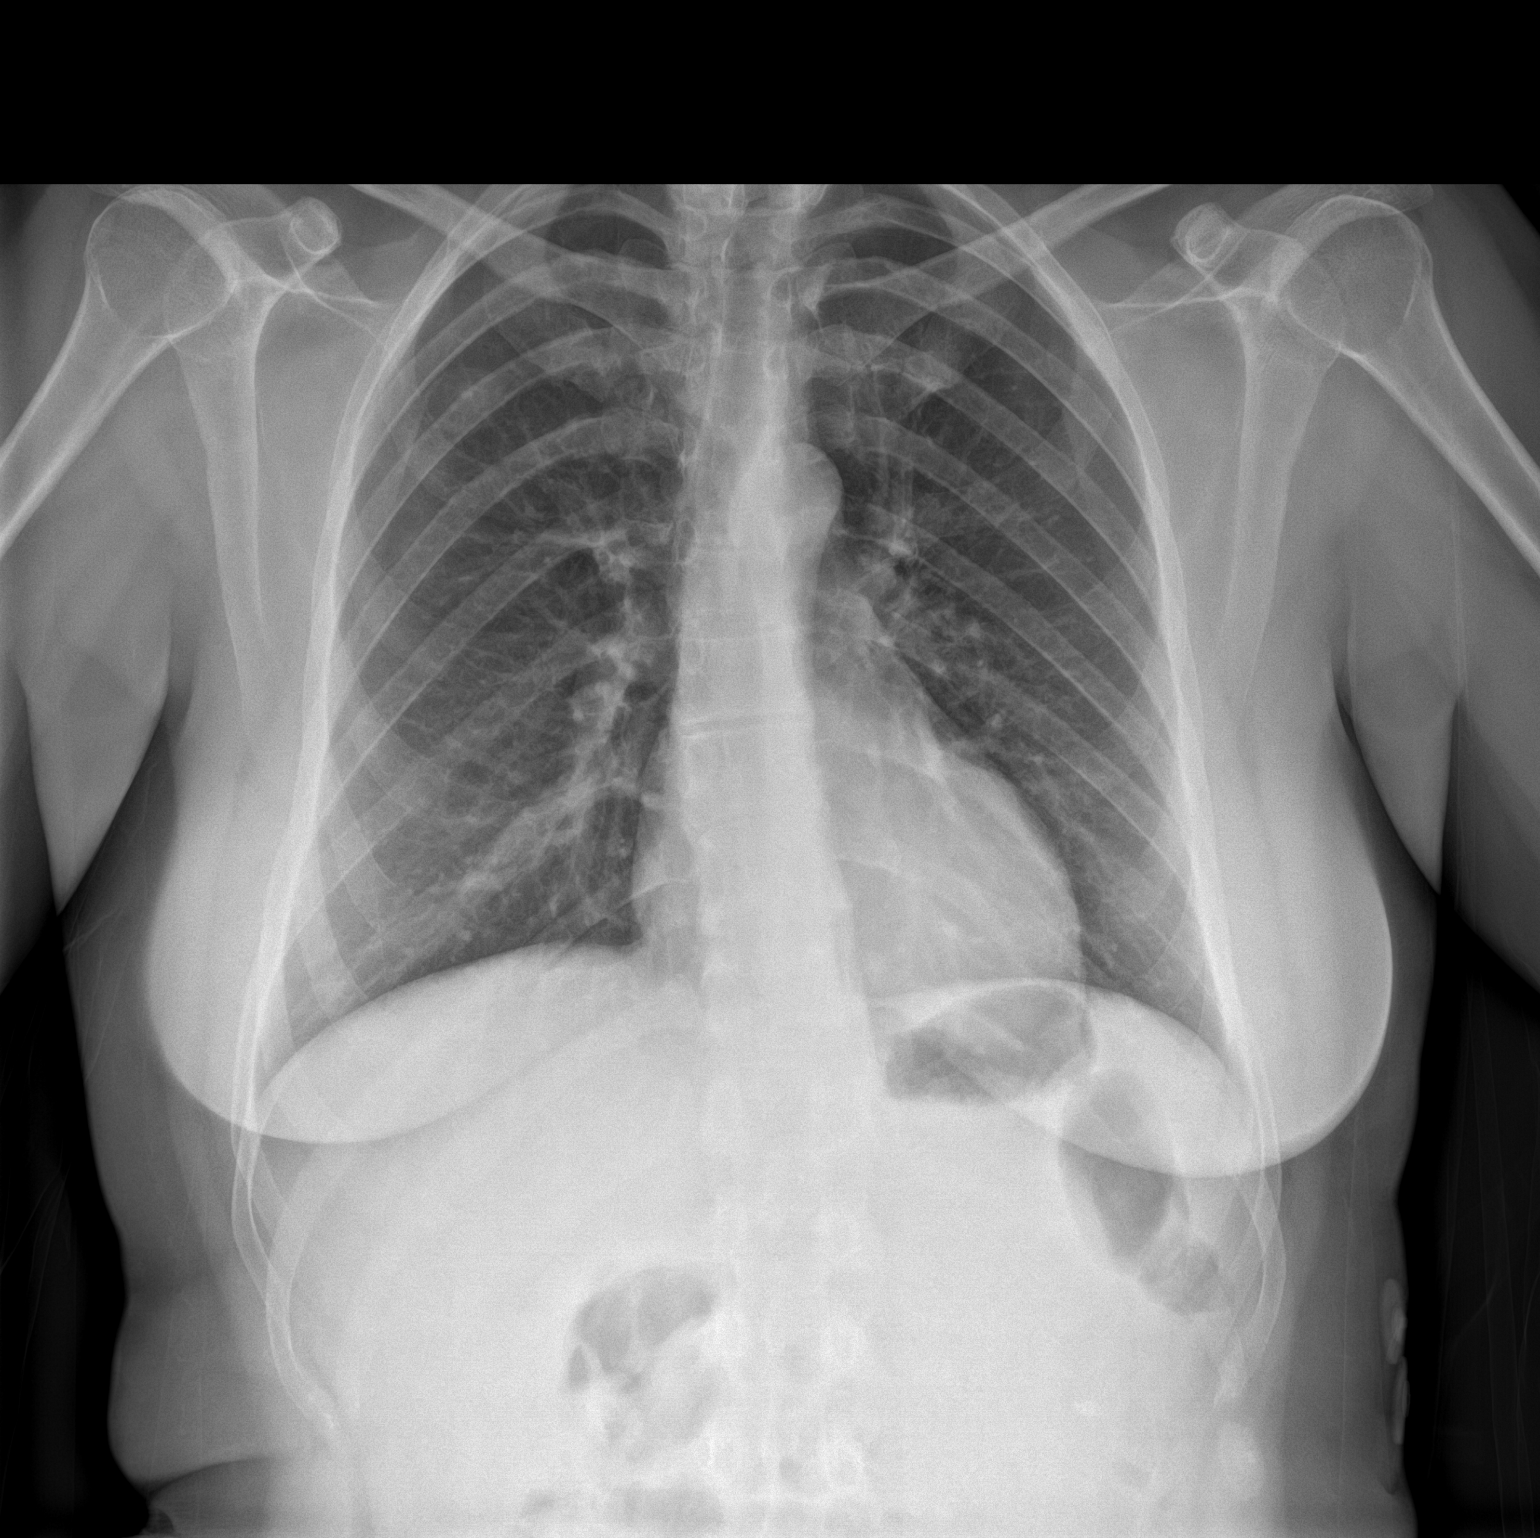

[chest lat]
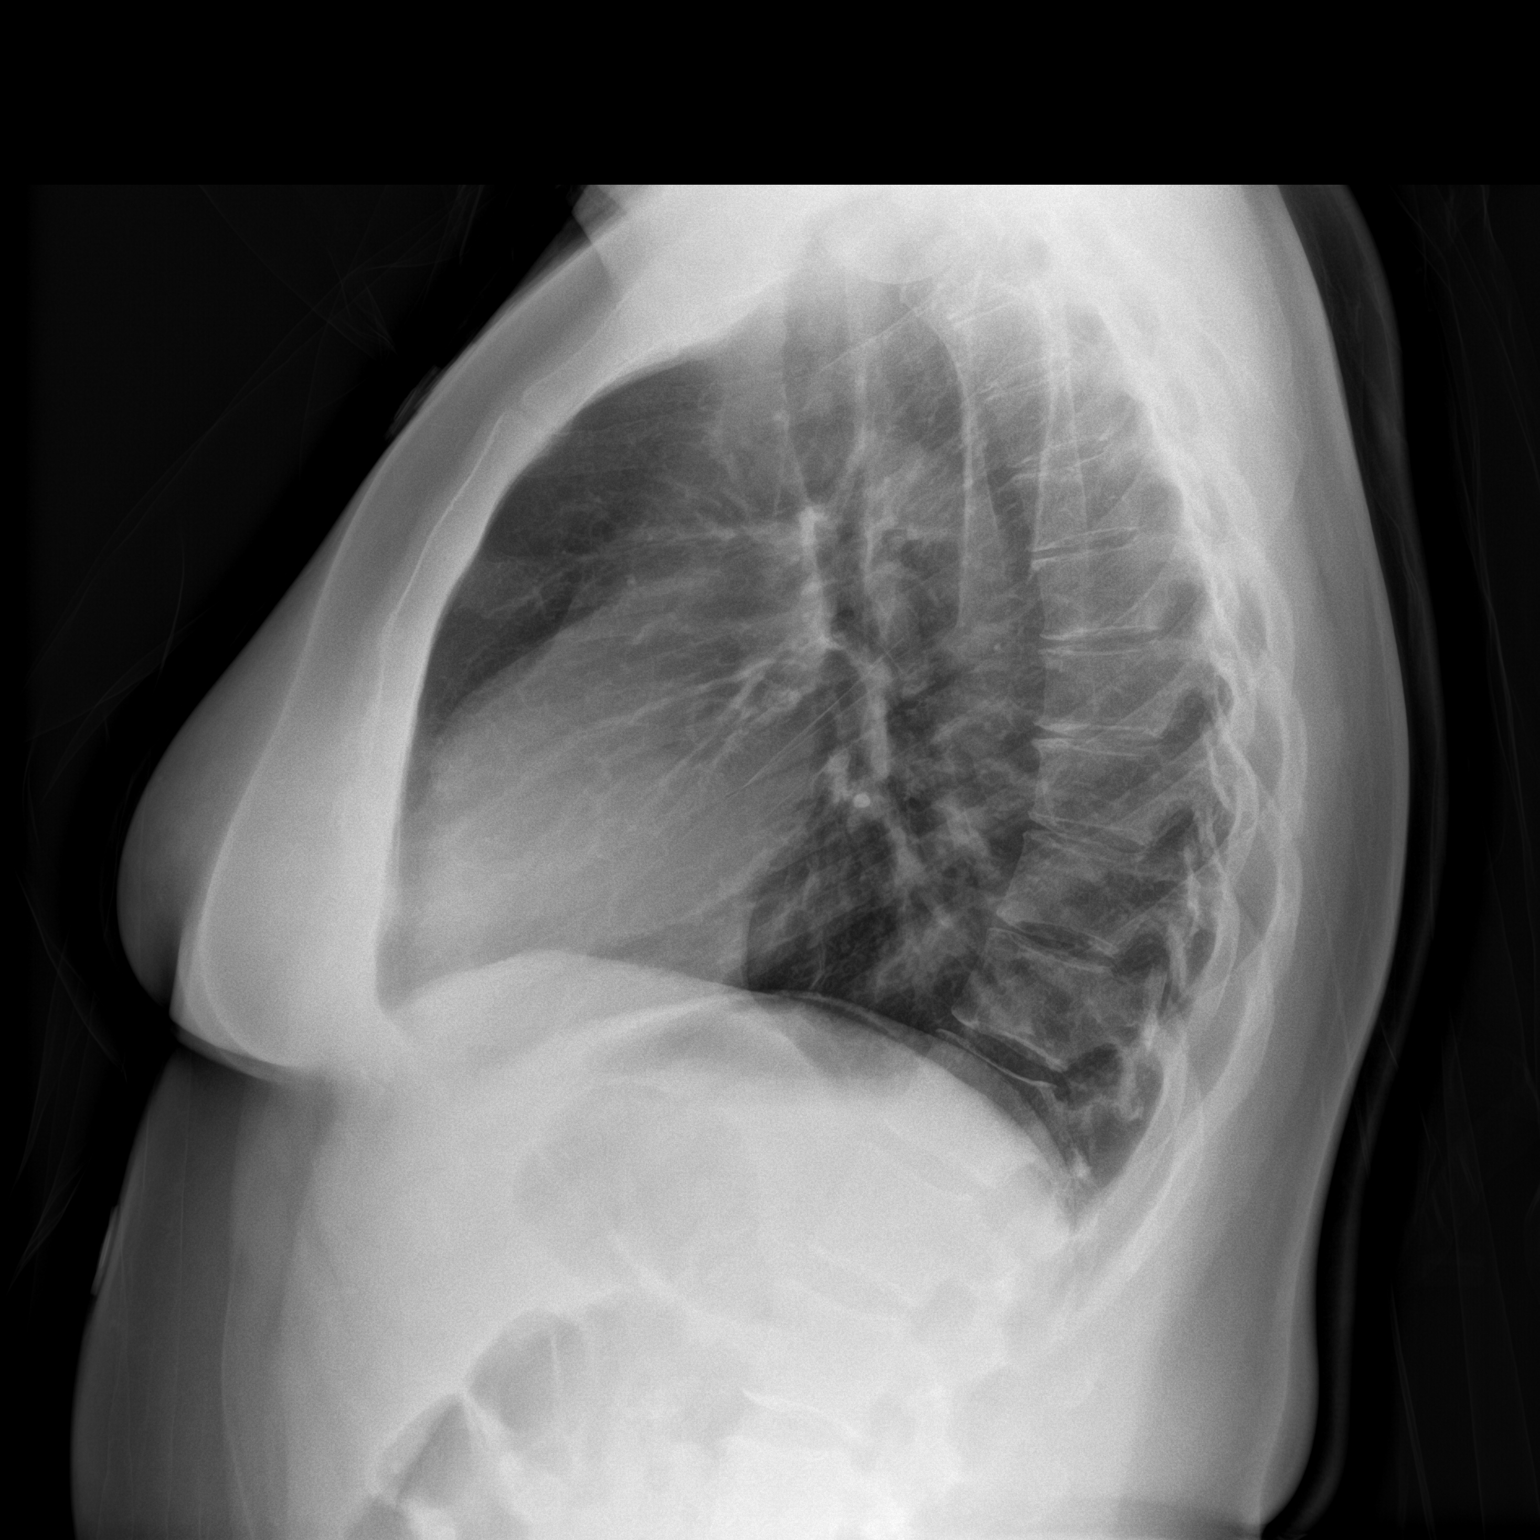

[2 of 2 positions shown; findings below may reference images not displayed]

FINDINGS: The heart size and mediastinal contours are within normal limits.
Both lungs are clear. The visualized skeletal structures are
unremarkable.
IMPRESSION: No active cardiopulmonary disease.

## 2022-12-09 DIAGNOSIS — S93431D Sprain of tibiofibular ligament of right ankle, subsequent encounter: Secondary | ICD-10-CM | POA: Diagnosis not present

## 2022-12-11 DIAGNOSIS — M25671 Stiffness of right ankle, not elsewhere classified: Secondary | ICD-10-CM | POA: Diagnosis not present

## 2022-12-11 DIAGNOSIS — Z7409 Other reduced mobility: Secondary | ICD-10-CM | POA: Diagnosis not present

## 2022-12-11 DIAGNOSIS — M25571 Pain in right ankle and joints of right foot: Secondary | ICD-10-CM | POA: Diagnosis not present

## 2022-12-11 DIAGNOSIS — Z789 Other specified health status: Secondary | ICD-10-CM | POA: Diagnosis not present

## 2022-12-20 DIAGNOSIS — M25571 Pain in right ankle and joints of right foot: Secondary | ICD-10-CM | POA: Diagnosis not present

## 2022-12-20 DIAGNOSIS — M25671 Stiffness of right ankle, not elsewhere classified: Secondary | ICD-10-CM | POA: Diagnosis not present

## 2022-12-20 DIAGNOSIS — Z789 Other specified health status: Secondary | ICD-10-CM | POA: Diagnosis not present

## 2022-12-20 DIAGNOSIS — Z7409 Other reduced mobility: Secondary | ICD-10-CM | POA: Diagnosis not present

## 2022-12-25 DIAGNOSIS — M25571 Pain in right ankle and joints of right foot: Secondary | ICD-10-CM | POA: Diagnosis not present

## 2022-12-25 DIAGNOSIS — M25671 Stiffness of right ankle, not elsewhere classified: Secondary | ICD-10-CM | POA: Diagnosis not present

## 2022-12-25 DIAGNOSIS — Z7409 Other reduced mobility: Secondary | ICD-10-CM | POA: Diagnosis not present

## 2022-12-25 DIAGNOSIS — Z789 Other specified health status: Secondary | ICD-10-CM | POA: Diagnosis not present

## 2023-01-03 DIAGNOSIS — M25571 Pain in right ankle and joints of right foot: Secondary | ICD-10-CM | POA: Diagnosis not present

## 2023-01-03 DIAGNOSIS — Z789 Other specified health status: Secondary | ICD-10-CM | POA: Diagnosis not present

## 2023-01-03 DIAGNOSIS — M25671 Stiffness of right ankle, not elsewhere classified: Secondary | ICD-10-CM | POA: Diagnosis not present

## 2023-01-03 DIAGNOSIS — Z7409 Other reduced mobility: Secondary | ICD-10-CM | POA: Diagnosis not present

## 2023-01-14 DIAGNOSIS — Z7409 Other reduced mobility: Secondary | ICD-10-CM | POA: Diagnosis not present

## 2023-01-14 DIAGNOSIS — M25671 Stiffness of right ankle, not elsewhere classified: Secondary | ICD-10-CM | POA: Diagnosis not present

## 2023-01-14 DIAGNOSIS — M25571 Pain in right ankle and joints of right foot: Secondary | ICD-10-CM | POA: Diagnosis not present

## 2023-01-14 DIAGNOSIS — Z789 Other specified health status: Secondary | ICD-10-CM | POA: Diagnosis not present

## 2023-01-20 DIAGNOSIS — M25571 Pain in right ankle and joints of right foot: Secondary | ICD-10-CM | POA: Diagnosis not present

## 2023-01-20 DIAGNOSIS — Z7409 Other reduced mobility: Secondary | ICD-10-CM | POA: Diagnosis not present

## 2023-01-20 DIAGNOSIS — M25671 Stiffness of right ankle, not elsewhere classified: Secondary | ICD-10-CM | POA: Diagnosis not present

## 2023-01-20 DIAGNOSIS — Z789 Other specified health status: Secondary | ICD-10-CM | POA: Diagnosis not present

## 2023-01-24 DIAGNOSIS — S93431D Sprain of tibiofibular ligament of right ankle, subsequent encounter: Secondary | ICD-10-CM | POA: Diagnosis not present

## 2023-01-29 DIAGNOSIS — M25571 Pain in right ankle and joints of right foot: Secondary | ICD-10-CM | POA: Diagnosis not present

## 2023-01-29 DIAGNOSIS — Z7409 Other reduced mobility: Secondary | ICD-10-CM | POA: Diagnosis not present

## 2023-01-29 DIAGNOSIS — Z789 Other specified health status: Secondary | ICD-10-CM | POA: Diagnosis not present

## 2023-01-29 DIAGNOSIS — M25671 Stiffness of right ankle, not elsewhere classified: Secondary | ICD-10-CM | POA: Diagnosis not present

## 2023-02-03 DIAGNOSIS — K200 Eosinophilic esophagitis: Secondary | ICD-10-CM | POA: Diagnosis not present

## 2023-02-03 DIAGNOSIS — K2289 Other specified disease of esophagus: Secondary | ICD-10-CM | POA: Diagnosis not present

## 2023-02-03 DIAGNOSIS — K449 Diaphragmatic hernia without obstruction or gangrene: Secondary | ICD-10-CM | POA: Diagnosis not present

## 2023-02-05 DIAGNOSIS — Z789 Other specified health status: Secondary | ICD-10-CM | POA: Diagnosis not present

## 2023-02-05 DIAGNOSIS — M25571 Pain in right ankle and joints of right foot: Secondary | ICD-10-CM | POA: Diagnosis not present

## 2023-02-05 DIAGNOSIS — M25671 Stiffness of right ankle, not elsewhere classified: Secondary | ICD-10-CM | POA: Diagnosis not present

## 2023-02-05 DIAGNOSIS — Z7409 Other reduced mobility: Secondary | ICD-10-CM | POA: Diagnosis not present

## 2023-02-11 DIAGNOSIS — Z789 Other specified health status: Secondary | ICD-10-CM | POA: Diagnosis not present

## 2023-02-11 DIAGNOSIS — Z7409 Other reduced mobility: Secondary | ICD-10-CM | POA: Diagnosis not present

## 2023-02-11 DIAGNOSIS — M25571 Pain in right ankle and joints of right foot: Secondary | ICD-10-CM | POA: Diagnosis not present

## 2023-02-11 DIAGNOSIS — M25671 Stiffness of right ankle, not elsewhere classified: Secondary | ICD-10-CM | POA: Diagnosis not present

## 2023-03-05 DIAGNOSIS — M25571 Pain in right ankle and joints of right foot: Secondary | ICD-10-CM | POA: Diagnosis not present

## 2023-03-05 DIAGNOSIS — Z7409 Other reduced mobility: Secondary | ICD-10-CM | POA: Diagnosis not present

## 2023-03-05 DIAGNOSIS — Z789 Other specified health status: Secondary | ICD-10-CM | POA: Diagnosis not present

## 2023-03-05 DIAGNOSIS — M25671 Stiffness of right ankle, not elsewhere classified: Secondary | ICD-10-CM | POA: Diagnosis not present

## 2023-03-12 DIAGNOSIS — M25671 Stiffness of right ankle, not elsewhere classified: Secondary | ICD-10-CM | POA: Diagnosis not present

## 2023-03-12 DIAGNOSIS — Z789 Other specified health status: Secondary | ICD-10-CM | POA: Diagnosis not present

## 2023-03-12 DIAGNOSIS — M25571 Pain in right ankle and joints of right foot: Secondary | ICD-10-CM | POA: Diagnosis not present

## 2023-03-12 DIAGNOSIS — Z7409 Other reduced mobility: Secondary | ICD-10-CM | POA: Diagnosis not present

## 2023-03-19 DIAGNOSIS — Z7409 Other reduced mobility: Secondary | ICD-10-CM | POA: Diagnosis not present

## 2023-03-19 DIAGNOSIS — M25671 Stiffness of right ankle, not elsewhere classified: Secondary | ICD-10-CM | POA: Diagnosis not present

## 2023-03-19 DIAGNOSIS — Z789 Other specified health status: Secondary | ICD-10-CM | POA: Diagnosis not present

## 2023-03-26 DIAGNOSIS — Z7409 Other reduced mobility: Secondary | ICD-10-CM | POA: Diagnosis not present

## 2023-03-26 DIAGNOSIS — Z789 Other specified health status: Secondary | ICD-10-CM | POA: Diagnosis not present

## 2023-03-26 DIAGNOSIS — M25671 Stiffness of right ankle, not elsewhere classified: Secondary | ICD-10-CM | POA: Diagnosis not present

## 2023-03-26 DIAGNOSIS — M25571 Pain in right ankle and joints of right foot: Secondary | ICD-10-CM | POA: Diagnosis not present

## 2023-03-28 DIAGNOSIS — M25671 Stiffness of right ankle, not elsewhere classified: Secondary | ICD-10-CM | POA: Diagnosis not present

## 2023-03-28 DIAGNOSIS — Z789 Other specified health status: Secondary | ICD-10-CM | POA: Diagnosis not present

## 2023-03-28 DIAGNOSIS — Z7409 Other reduced mobility: Secondary | ICD-10-CM | POA: Diagnosis not present

## 2023-03-28 DIAGNOSIS — M25571 Pain in right ankle and joints of right foot: Secondary | ICD-10-CM | POA: Diagnosis not present

## 2023-03-31 DIAGNOSIS — M25671 Stiffness of right ankle, not elsewhere classified: Secondary | ICD-10-CM | POA: Diagnosis not present

## 2023-03-31 DIAGNOSIS — Z7409 Other reduced mobility: Secondary | ICD-10-CM | POA: Diagnosis not present

## 2023-03-31 DIAGNOSIS — Z789 Other specified health status: Secondary | ICD-10-CM | POA: Diagnosis not present

## 2023-04-03 DIAGNOSIS — M25671 Stiffness of right ankle, not elsewhere classified: Secondary | ICD-10-CM | POA: Diagnosis not present

## 2023-04-03 DIAGNOSIS — Z789 Other specified health status: Secondary | ICD-10-CM | POA: Diagnosis not present

## 2023-04-03 DIAGNOSIS — Z7409 Other reduced mobility: Secondary | ICD-10-CM | POA: Diagnosis not present

## 2023-04-03 DIAGNOSIS — M25571 Pain in right ankle and joints of right foot: Secondary | ICD-10-CM | POA: Diagnosis not present

## 2023-05-15 DIAGNOSIS — M25571 Pain in right ankle and joints of right foot: Secondary | ICD-10-CM | POA: Diagnosis not present

## 2023-05-15 DIAGNOSIS — M25671 Stiffness of right ankle, not elsewhere classified: Secondary | ICD-10-CM | POA: Diagnosis not present

## 2023-05-15 DIAGNOSIS — Z7409 Other reduced mobility: Secondary | ICD-10-CM | POA: Diagnosis not present

## 2023-05-15 DIAGNOSIS — Z789 Other specified health status: Secondary | ICD-10-CM | POA: Diagnosis not present

## 2023-06-11 DIAGNOSIS — R051 Acute cough: Secondary | ICD-10-CM | POA: Diagnosis not present

## 2023-06-11 DIAGNOSIS — R0602 Shortness of breath: Secondary | ICD-10-CM | POA: Diagnosis not present

## 2023-06-17 DIAGNOSIS — Z7409 Other reduced mobility: Secondary | ICD-10-CM | POA: Diagnosis not present

## 2023-06-17 DIAGNOSIS — M25561 Pain in right knee: Secondary | ICD-10-CM | POA: Diagnosis not present

## 2023-06-17 DIAGNOSIS — M25671 Stiffness of right ankle, not elsewhere classified: Secondary | ICD-10-CM | POA: Diagnosis not present

## 2023-06-17 DIAGNOSIS — M25571 Pain in right ankle and joints of right foot: Secondary | ICD-10-CM | POA: Diagnosis not present

## 2023-06-23 DIAGNOSIS — M25561 Pain in right knee: Secondary | ICD-10-CM | POA: Diagnosis not present

## 2023-06-23 DIAGNOSIS — Z7409 Other reduced mobility: Secondary | ICD-10-CM | POA: Diagnosis not present

## 2023-06-23 DIAGNOSIS — M25571 Pain in right ankle and joints of right foot: Secondary | ICD-10-CM | POA: Diagnosis not present

## 2023-06-23 DIAGNOSIS — M25671 Stiffness of right ankle, not elsewhere classified: Secondary | ICD-10-CM | POA: Diagnosis not present

## 2023-06-30 DIAGNOSIS — S93431D Sprain of tibiofibular ligament of right ankle, subsequent encounter: Secondary | ICD-10-CM | POA: Diagnosis not present

## 2023-07-14 DIAGNOSIS — M25561 Pain in right knee: Secondary | ICD-10-CM | POA: Diagnosis not present

## 2023-07-14 DIAGNOSIS — M25571 Pain in right ankle and joints of right foot: Secondary | ICD-10-CM | POA: Diagnosis not present

## 2023-07-14 DIAGNOSIS — M25671 Stiffness of right ankle, not elsewhere classified: Secondary | ICD-10-CM | POA: Diagnosis not present

## 2023-07-14 DIAGNOSIS — Z7409 Other reduced mobility: Secondary | ICD-10-CM | POA: Diagnosis not present

## 2023-07-18 DIAGNOSIS — S93431D Sprain of tibiofibular ligament of right ankle, subsequent encounter: Secondary | ICD-10-CM | POA: Diagnosis not present

## 2023-07-21 DIAGNOSIS — Z7409 Other reduced mobility: Secondary | ICD-10-CM | POA: Diagnosis not present

## 2023-07-21 DIAGNOSIS — M25671 Stiffness of right ankle, not elsewhere classified: Secondary | ICD-10-CM | POA: Diagnosis not present

## 2023-07-21 DIAGNOSIS — M25561 Pain in right knee: Secondary | ICD-10-CM | POA: Diagnosis not present

## 2023-07-21 DIAGNOSIS — M25571 Pain in right ankle and joints of right foot: Secondary | ICD-10-CM | POA: Diagnosis not present

## 2023-07-22 DIAGNOSIS — D2339 Other benign neoplasm of skin of other parts of face: Secondary | ICD-10-CM | POA: Diagnosis not present

## 2023-07-28 DIAGNOSIS — M25671 Stiffness of right ankle, not elsewhere classified: Secondary | ICD-10-CM | POA: Diagnosis not present

## 2023-07-28 DIAGNOSIS — M25561 Pain in right knee: Secondary | ICD-10-CM | POA: Diagnosis not present

## 2023-07-28 DIAGNOSIS — Z7409 Other reduced mobility: Secondary | ICD-10-CM | POA: Diagnosis not present

## 2023-07-28 DIAGNOSIS — M25571 Pain in right ankle and joints of right foot: Secondary | ICD-10-CM | POA: Diagnosis not present

## 2023-08-11 ENCOUNTER — Encounter (HOSPITAL_BASED_OUTPATIENT_CLINIC_OR_DEPARTMENT_OTHER): Payer: Self-pay | Admitting: Orthopedic Surgery

## 2023-08-12 NOTE — H&P (Signed)
 PREOPERATIVE H&P  Chief Complaint: RIGHT ANKLE PAINFUL HARDWARE  HPI: Sheila Graham is a 43 y.o. female who presents with a diagnosis of RIGHT ANKLE PAINFUL HARDWARE. Symptoms are rated as moderate to severe, and have been worsening.  This is significantly impairing activities of daily living.  She has elected for surgical management.   Past Medical History:  Diagnosis Date   Esophageal web    History of kidney stones    Past Surgical History:  Procedure Laterality Date   ESOPHAGOGASTRODUODENOSCOPY N/A 04/07/2016   Procedure: ESOPHAGOGASTRODUODENOSCOPY (EGD);  Surgeon: Sheila Lah, MD;  Location: [facility] ENDOSCOPY;  Service: Gastroenterology;  Laterality: N/A;   HERNIA REPAIR     Social History   Socioeconomic History   Marital status: Married    Spouse name: Not on file   Number of children: Not on file   Years of education: Not on file   Highest education level: Not on file  Occupational History   Not on file  Tobacco Use   Smoking status: Never   Smokeless tobacco: Never  Vaping Use   Vaping status: Not on file  Substance and Sexual Activity   Alcohol use: Yes    Comment: occ   Drug use: Never   Sexual activity: Not on file  Other Topics Concern   Not on file  Social History Narrative   Not on file   Social Drivers of Health   Financial Resource Strain: Not on file  Food Insecurity: No Food Insecurity (02/02/2022)   Received from [facility], [facility]   Hunger Vital Sign    Worried About Running Out of Food in the Last Year: Never true    Ran Out of Food in the Last Year: Never true  Transportation Needs: Not on file  Physical Activity: Not on file  Stress: Not on file  Social Connections: Unknown (12/15/2021)   Received from [facility], [facility]   Social Network    Social Network: Not on file   No family history on file. No Known Allergies Prior to Admission medications   Medication Sig Start Date End Date Taking? Authorizing Provider   dicyclomine  (BENTYL ) 20 MG tablet Take 1 tablet (20 mg total) by mouth 3 (three) times daily as needed for spasms. 08/30/21   Long, Fonda MATSU, MD  levonorgestrel  (KYLEENA ) 19.5 MG IUD See admin instructions. iud 11/22/16   [provider]     Positive ROS: All other systems have been reviewed and were otherwise negative with the exception of those mentioned in the HPI and as above.  Physical Exam: General: Alert, no acute distress Cardiovascular: No pedal edema Respiratory: No cyanosis, no use of accessory musculature GI: No organomegaly, abdomen is soft and non-tender Skin: No lesions in the area of chief complaint Neurologic: Sensation intact distally Psychiatric: Patient is competent for consent with normal mood and affect Lymphatic: No axillary or cervical lymphadenopathy  MUSCULOSKELETAL: TTP medial and lateral ankle directly over the hardware, ROM decreased all directions, well healed surgical scars, NVI   Imaging: xrays show stable implantation of a 2 hole lateral plate and 2 fiberwire tightropes    Assessment: RIGHT ANKLE PAINFUL HARDWARE  Plan: Plan for Procedure(s): HARDWARE REMOVAL  The risks benefits and alternatives were discussed with the patient including but not limited to the risks of nonoperative treatment, versus surgical intervention including infection, bleeding, nerve injury,  blood clots, cardiopulmonary complications, morbidity, mortality, among others, and they were willing to proceed.   Weightbearing: WBAT RLE Orthopedic  devices: +/- boot? Showering: POD 3 Dressing: reinforce PRN Medicines: Norco, Mobic, Zofran   Discharge: home Follow up: 08/29/23 at 11:15am    Sheila CHRISTELLA Large, PA-C Office [phone] 08/12/2023 5:10 PM

## 2023-08-19 ENCOUNTER — Ambulatory Visit (HOSPITAL_BASED_OUTPATIENT_CLINIC_OR_DEPARTMENT_OTHER): Payer: Federal, State, Local not specified - PPO | Admitting: Anesthesiology

## 2023-08-19 ENCOUNTER — Ambulatory Visit (HOSPITAL_BASED_OUTPATIENT_CLINIC_OR_DEPARTMENT_OTHER): Payer: Federal, State, Local not specified - PPO

## 2023-08-19 ENCOUNTER — Other Ambulatory Visit: Payer: Self-pay

## 2023-08-19 ENCOUNTER — Encounter (HOSPITAL_BASED_OUTPATIENT_CLINIC_OR_DEPARTMENT_OTHER)
Admission: RE | Disposition: A | Discharge status: Home / Self Care | Payer: Self-pay | Source: Ambulatory Visit | Attending: Orthopedic Surgery

## 2023-08-19 ENCOUNTER — Ambulatory Visit (HOSPITAL_BASED_OUTPATIENT_CLINIC_OR_DEPARTMENT_OTHER)
Admission: RE | Admit: 2023-08-19 | Discharge: 2023-08-19 | Disposition: A | Discharge status: Home / Self Care | Payer: Federal, State, Local not specified - PPO | Source: Ambulatory Visit | Attending: Orthopedic Surgery | Admitting: Orthopedic Surgery

## 2023-08-19 ENCOUNTER — Encounter (HOSPITAL_BASED_OUTPATIENT_CLINIC_OR_DEPARTMENT_OTHER): Payer: Self-pay | Admitting: Orthopedic Surgery

## 2023-08-19 DIAGNOSIS — T8484XA Pain due to internal orthopedic prosthetic devices, implants and grafts, initial encounter: Secondary | ICD-10-CM | POA: Diagnosis not present

## 2023-08-19 DIAGNOSIS — Y831 Surgical operation with implant of artificial internal device as the cause of abnormal reaction of the patient, or of later complication, without mention of misadventure at the time of the procedure: Secondary | ICD-10-CM | POA: Diagnosis not present

## 2023-08-19 DIAGNOSIS — S134XXA Sprain of ligaments of cervical spine, initial encounter: Secondary | ICD-10-CM

## 2023-08-19 DIAGNOSIS — Z01818 Encounter for other preprocedural examination: Secondary | ICD-10-CM

## 2023-08-19 HISTORY — DX: Personal history of urinary calculi: Z87.442

## 2023-08-19 HISTORY — PX: HARDWARE REMOVAL: SHX979

## 2023-08-19 SURGERY — REMOVAL, HARDWARE
Anesthesia: General | Site: Ankle | Laterality: Right

## 2023-08-19 MED ORDER — ONDANSETRON HCL 4 MG/2ML IJ SOLN
INTRAMUSCULAR | Status: AC
  Filled 2023-08-19: qty 2

## 2023-08-19 MED ORDER — CEFAZOLIN SODIUM-DEXTROSE 2-4 GM/100ML-% IV SOLN
INTRAVENOUS | Status: AC
  Filled 2023-08-19: qty 100

## 2023-08-19 MED ORDER — ONDANSETRON HCL 4 MG/2ML IJ SOLN: 4.0000 mg | Freq: Four times a day (QID) | INTRAMUSCULAR | Status: DC | PRN

## 2023-08-19 MED ORDER — FENTANYL CITRATE (PF) 100 MCG/2ML IJ SOLN
INTRAMUSCULAR | Status: AC
  Filled 2023-08-19: qty 2

## 2023-08-19 MED ORDER — PROPOFOL 500 MG/50ML IV EMUL
INTRAVENOUS | Status: DC | PRN
  Administered 2023-08-19: 175 ug/kg/min via INTRAVENOUS

## 2023-08-19 MED ORDER — 0.9 % SODIUM CHLORIDE (POUR BTL) OPTIME
TOPICAL | Status: DC | PRN
  Administered 2023-08-19: 400 mL

## 2023-08-19 MED ORDER — MIDAZOLAM HCL 5 MG/5ML IJ SOLN
INTRAMUSCULAR | Status: DC | PRN
  Administered 2023-08-19: 2 mg via INTRAVENOUS

## 2023-08-19 MED ORDER — LACTATED RINGERS IV SOLN: INTRAVENOUS | Status: DC

## 2023-08-19 MED ORDER — AMISULPRIDE (ANTIEMETIC) 5 MG/2ML IV SOLN
10.0000 mg | Freq: Once | INTRAVENOUS | Status: AC
  Administered 2023-08-19: 10 mg via INTRAVENOUS

## 2023-08-19 MED ORDER — PROPOFOL 10 MG/ML IV BOLUS
INTRAVENOUS | Status: DC | PRN
  Administered 2023-08-19 (×2): 50 mg via INTRAVENOUS
  Administered 2023-08-19: 200 mg via INTRAVENOUS

## 2023-08-19 MED ORDER — DEXMEDETOMIDINE HCL IN NACL 80 MCG/20ML IV SOLN
INTRAVENOUS | Status: AC
  Filled 2023-08-19: qty 20

## 2023-08-19 MED ORDER — SODIUM CHLORIDE 0.9 % IV SOLN: INTRAVENOUS | Status: DC | PRN

## 2023-08-19 MED ORDER — MIDAZOLAM HCL 2 MG/2ML IJ SOLN
INTRAMUSCULAR | Status: AC
  Filled 2023-08-19: qty 2

## 2023-08-19 MED ORDER — OXYCODONE HCL 5 MG/5ML PO SOLN: 5.0000 mg | Freq: Once | ORAL | Status: DC | PRN

## 2023-08-19 MED ORDER — ONDANSETRON 4 MG PO TBDP: 4.0000 mg | ORAL_TABLET | Freq: Three times a day (TID) | ORAL | 0 refills | Status: AC | PRN

## 2023-08-19 MED ORDER — POVIDONE-IODINE 10 % EX SWAB
2.0000 | Freq: Once | CUTANEOUS | Status: AC
  Administered 2023-08-19: 2 via TOPICAL

## 2023-08-19 MED ORDER — KETOROLAC TROMETHAMINE 30 MG/ML IJ SOLN
INTRAMUSCULAR | Status: DC | PRN
  Administered 2023-08-19: 30 mg via INTRAVENOUS

## 2023-08-19 MED ORDER — PROPOFOL 10 MG/ML IV BOLUS
INTRAVENOUS | Status: AC
  Filled 2023-08-19: qty 20

## 2023-08-19 MED ORDER — LIDOCAINE HCL (CARDIAC) PF 100 MG/5ML IV SOSY
PREFILLED_SYRINGE | INTRAVENOUS | Status: DC | PRN
  Administered 2023-08-19: 60 mg via INTRAVENOUS

## 2023-08-19 MED ORDER — AMISULPRIDE (ANTIEMETIC) 5 MG/2ML IV SOLN
INTRAVENOUS | Status: AC
  Filled 2023-08-19: qty 4

## 2023-08-19 MED ORDER — LIDOCAINE 2% (20 MG/ML) 5 ML SYRINGE
INTRAMUSCULAR | Status: AC
  Filled 2023-08-19: qty 5

## 2023-08-19 MED ORDER — FENTANYL CITRATE (PF) 100 MCG/2ML IJ SOLN
INTRAMUSCULAR | Status: DC | PRN
  Administered 2023-08-19 (×3): 50 ug via INTRAVENOUS

## 2023-08-19 MED ORDER — ONDANSETRON HCL 4 MG/2ML IJ SOLN
INTRAMUSCULAR | Status: DC | PRN
  Administered 2023-08-19: 4 mg via INTRAVENOUS

## 2023-08-19 MED ORDER — BUPIVACAINE HCL (PF) 0.5 % IJ SOLN
INTRAMUSCULAR | Status: AC
  Filled 2023-08-19: qty 30

## 2023-08-19 MED ORDER — CEFAZOLIN SODIUM-DEXTROSE 2-4 GM/100ML-% IV SOLN
2.0000 g | INTRAVENOUS | Status: AC
Start: 2023-08-19 — End: 2023-08-19
  Administered 2023-08-19: 2 g via INTRAVENOUS

## 2023-08-19 MED ORDER — HYDROCODONE-ACETAMINOPHEN 10-325 MG PO TABS: 1.0000 | ORAL_TABLET | Freq: Three times a day (TID) | ORAL | 0 refills | Status: AC | PRN

## 2023-08-19 MED ORDER — DEXAMETHASONE SODIUM PHOSPHATE 10 MG/ML IJ SOLN
8.0000 mg | Freq: Once | INTRAMUSCULAR | Status: AC
  Administered 2023-08-19: 8 mg via INTRAVENOUS

## 2023-08-19 MED ORDER — KETOROLAC TROMETHAMINE 30 MG/ML IJ SOLN
INTRAMUSCULAR | Status: AC
  Filled 2023-08-19: qty 1

## 2023-08-19 MED ORDER — BUPIVACAINE HCL (PF) 0.5 % IJ SOLN
INTRAMUSCULAR | Status: DC | PRN
  Administered 2023-08-19: 10 mL

## 2023-08-19 MED ORDER — OXYCODONE HCL 5 MG PO TABS: 5.0000 mg | ORAL_TABLET | Freq: Once | ORAL | Status: DC | PRN

## 2023-08-19 MED ORDER — DEXMEDETOMIDINE HCL IN NACL 80 MCG/20ML IV SOLN
INTRAVENOUS | Status: DC | PRN
  Administered 2023-08-19: 8 ug via INTRAVENOUS
  Administered 2023-08-19: 4 ug via INTRAVENOUS

## 2023-08-19 MED ORDER — ACETAMINOPHEN 500 MG PO TABS
ORAL_TABLET | ORAL | Status: AC
  Filled 2023-08-19: qty 2

## 2023-08-19 MED ORDER — ACETAMINOPHEN 500 MG PO TABS
1000.0000 mg | ORAL_TABLET | Freq: Once | ORAL | Status: AC
  Administered 2023-08-19: 1000 mg via ORAL

## 2023-08-19 MED ORDER — DEXAMETHASONE SODIUM PHOSPHATE 10 MG/ML IJ SOLN
INTRAMUSCULAR | Status: AC
  Filled 2023-08-19: qty 1

## 2023-08-19 MED ORDER — FENTANYL CITRATE (PF) 100 MCG/2ML IJ SOLN
25.0000 ug | INTRAMUSCULAR | Status: DC | PRN
  Administered 2023-08-19: 25 ug via INTRAVENOUS

## 2023-08-19 SURGICAL SUPPLY — 61 items
BLADE SURG 15 STRL LF DISP TIS (BLADE) ×1 IMPLANT
BNDG COHESIVE 4X5 TAN STRL LF (GAUZE/BANDAGES/DRESSINGS) ×1 IMPLANT
BNDG ELASTIC 4INX 5YD STR LF (GAUZE/BANDAGES/DRESSINGS) ×1 IMPLANT
BNDG ESMARK 4X9 LF (GAUZE/BANDAGES/DRESSINGS) ×1 IMPLANT
CHLORAPREP W/TINT 26 (MISCELLANEOUS) ×1 IMPLANT
CLSR STERI-STRIP ANTIMIC 1/2X4 (GAUZE/BANDAGES/DRESSINGS) ×1 IMPLANT
COVER BACK TABLE 60X90IN (DRAPES) ×1 IMPLANT
CUFF TRNQT CYL 24X4X16.5-23 (TOURNIQUET CUFF) IMPLANT
CUFF TRNQT CYL 34X4.125X (TOURNIQUET CUFF) IMPLANT
DRAPE EXTREMITY T 121X128X90 (DISPOSABLE) ×1 IMPLANT
DRAPE IMP U-DRAPE 54X76 (DRAPES) ×1 IMPLANT
DRAPE INCISE IOBAN 66X45 STRL (DRAPES) ×1 IMPLANT
DRAPE OEC MINIVIEW 54X84 (DRAPES) ×1 IMPLANT
DRAPE SURG 17X23 STRL (DRAPES) IMPLANT
DRAPE U-SHAPE 47X51 STRL (DRAPES) IMPLANT
DRSG EMULSION OIL 3X3 NADH (GAUZE/BANDAGES/DRESSINGS) ×1 IMPLANT
DRSG MEPILEX POST OP 4X8 (GAUZE/BANDAGES/DRESSINGS) IMPLANT
ELECT REM PT RETURN 9FT ADLT (ELECTROSURGICAL) ×1
ELECTRODE REM PT RTRN 9FT ADLT (ELECTROSURGICAL) ×1 IMPLANT
GAUZE SPONGE 4X4 12PLY STRL (GAUZE/BANDAGES/DRESSINGS) ×1 IMPLANT
GAUZE XEROFORM 1X8 LF (GAUZE/BANDAGES/DRESSINGS) IMPLANT
GLOVE BIO SURGEON STRL SZ7 (GLOVE) IMPLANT
GLOVE BIO SURGEON STRL SZ7.5 (GLOVE) ×1 IMPLANT
GLOVE BIOGEL PI IND STRL 7.0 (GLOVE) IMPLANT
GLOVE BIOGEL PI IND STRL 7.5 (GLOVE) ×1 IMPLANT
GLOVE BIOGEL PI IND STRL 8 (GLOVE) ×1 IMPLANT
GLOVE SURG SS PI 7.0 STRL IVOR (GLOVE) IMPLANT
GLOVE SURG SYN 7.5 E (GLOVE)
GLOVE SURG SYN 7.5 PF PI (GLOVE) ×1 IMPLANT
GOWN STRL REUS W/ TWL LRG LVL3 (GOWN DISPOSABLE) ×2 IMPLANT
GOWN STRL REUS W/ TWL XL LVL3 (GOWN DISPOSABLE) ×1 IMPLANT
MANIFOLD NEPTUNE II (INSTRUMENTS) IMPLANT
NDL HYPO 25X1 1.5 SAFETY (NEEDLE) ×1 IMPLANT
NEEDLE HYPO 25X1 1.5 SAFETY (NEEDLE) ×1
NS IRRIG 1000ML POUR BTL (IV SOLUTION) ×1 IMPLANT
PACK BASIN DAY SURGERY FS (CUSTOM PROCEDURE TRAY) ×1 IMPLANT
PAD CAST 4YDX4 CTTN HI CHSV (CAST SUPPLIES) ×1 IMPLANT
PADDING CAST ABS COTTON 4X4 ST (CAST SUPPLIES) ×1 IMPLANT
PENCIL SMOKE EVACUATOR (MISCELLANEOUS) ×1 IMPLANT
SHEET MEDIUM DRAPE 40X70 STRL (DRAPES) IMPLANT
SLEEVE SCD COMPRESS KNEE MED (STOCKING) IMPLANT
SPIKE FLUID TRANSFER (MISCELLANEOUS) IMPLANT
SPONGE T-LAP 4X18 ~~LOC~~+RFID (SPONGE) ×1 IMPLANT
STOCKINETTE 4X48 STRL (DRAPES) IMPLANT
STOCKINETTE 6 STRL (DRAPES) ×1 IMPLANT
STOCKINETTE IMPERVIOUS LG (DRAPES) ×1 IMPLANT
SUCTION TUBE FRAZIER 10FR DISP (SUCTIONS) IMPLANT
SUT ETHILON 3 0 PS 1 (SUTURE) IMPLANT
SUT MNCRL AB 3-0 PS2 27 (SUTURE) IMPLANT
SUT MNCRL AB 4-0 PS2 18 (SUTURE) IMPLANT
SUT MON AB 2-0 CT1 36 (SUTURE) IMPLANT
SUT MON AB 4-0 PC3 18 (SUTURE) IMPLANT
SUT PROLENE 3 0 PS 2 (SUTURE) IMPLANT
SUT VIC AB 0 CT1 27XBRD ANBCTR (SUTURE) IMPLANT
SUT VIC AB 2-0 SH 27XBRD (SUTURE) IMPLANT
SUT VIC AB 3-0 FS2 27 (SUTURE) IMPLANT
SYR BULB EAR ULCER 3OZ GRN STR (SYRINGE) ×1 IMPLANT
SYR CONTROL 10ML LL (SYRINGE) IMPLANT
TOWEL GREEN STERILE FF (TOWEL DISPOSABLE) ×1 IMPLANT
TUBE CONNECTING 20X1/4 (TUBING) IMPLANT
UNDERPAD 30X36 HEAVY ABSORB (UNDERPADS AND DIAPERS) ×1 IMPLANT

## 2023-08-19 NOTE — Anesthesia Postprocedure Evaluation (Signed)
 Anesthesia Post Note  Patient: Sheila Graham  Procedure(s) Performed: HARDWARE REMOVAL (Right: Ankle)     Patient location during evaluation: PACU Anesthesia Type: General Level of consciousness: awake and alert Pain management: pain level controlled Vital Signs Assessment: post-procedure vital signs reviewed and stable Respiratory status: spontaneous breathing, nonlabored ventilation, respiratory function stable and patient connected to nasal cannula oxygen Cardiovascular status: blood pressure returned to baseline and stable Postop Assessment: no apparent nausea or vomiting Anesthetic complications: no   No notable events documented.  Last Vitals:  Vitals:   08/19/23 1000 08/19/23 1015  BP: 95/81   Pulse: 60 (!) 59  Resp: 12 12  Temp:    SpO2: 96% 98%    Last Pain:  Vitals:   08/19/23 1007  TempSrc:   PainSc: 6                  Jullian Clayson S

## 2023-08-19 NOTE — Interval H&P Note (Signed)
 History and Physical Interval Note:  08/19/2023 7:03 AM  Sheila Graham  has presented today for surgery, with the diagnosis of RIGHT ANKLE PAINFUL HARDWARE.  The various methods of treatment have been discussed with the patient and family. After consideration of risks, benefits and other options for treatment, the patient has consented to  Procedure(s): HARDWARE REMOVAL (Right) as a surgical intervention.  The patient's history has been reviewed, patient examined, no change in status, stable for surgery.  I have reviewed the patient's chart and labs.  Questions were answered to the patient's satisfaction.     [name]

## 2023-08-19 NOTE — Anesthesia Procedure Notes (Signed)
 Procedure Name: LMA Insertion Date/Time: 08/19/2023 8:52 AM  Performed by: Buster Catheryn SAUNDERS, CRNAPre-anesthesia Checklist: Patient identified, Emergency Drugs available, Suction available and Patient being monitored Patient Re-evaluated:Patient Re-evaluated prior to induction Oxygen Delivery Method: Circle system utilized Preoxygenation: Pre-oxygenation with 100% oxygen Induction Type: IV induction Ventilation: Mask ventilation without difficulty LMA: LMA inserted LMA Size: 4.0 Number of attempts: 1 Placement Confirmation: positive ETCO2 Tube secured with: Tape Dental Injury: Teeth and Oropharynx as per pre-operative assessment

## 2023-08-19 NOTE — Transfer of Care (Signed)
 Immediate Anesthesia Transfer of Care Note  Patient: Sheila Graham  Procedure(s) Performed: HARDWARE REMOVAL (Right: Ankle)  Patient Location: PACU  Anesthesia Type:General  Level of Consciousness: awake, alert , and oriented  Airway & Oxygen Therapy: Patient Spontanous Breathing and Patient connected to face mask oxygen  Post-op Assessment: Report given to RN and Post -op Vital signs reviewed and stable  Post vital signs: Reviewed and stable  Last Vitals:  Vitals Value Taken Time  BP 107/61 08/19/23 0942  Temp    Pulse 86 08/19/23 0944  Resp 19 08/19/23 0944  SpO2 100 % 08/19/23 0944  Vitals shown include unfiled device data.  Last Pain:  Vitals:   08/19/23 0706  TempSrc: Tympanic  PainSc: 3          Complications: No notable events documented.

## 2023-08-19 NOTE — Op Note (Signed)
 08/19/2023  9:33 AM  PATIENT:  Sheila Graham    PRE-OPERATIVE DIAGNOSIS:  RIGHT ANKLE PAINFUL HARDWARE  POST-OPERATIVE DIAGNOSIS:  Same  PROCEDURE:  HARDWARE REMOVAL  SURGEON:  [name], MD  ASSISTANT: Gerard Large, PA-C, he was present and scrubbed throughout the case, critical for completion in a timely fashion, and for retraction, instrumentation, and closure.   ANESTHESIA:   geen  PREOPERATIVE INDICATIONS:  [name] is a  43 y.o. female with a diagnosis of RIGHT ANKLE PAINFUL HARDWARE who failed conservative measures and elected for surgical management.    The risks benefits and alternatives were discussed with the patient preoperatively including but not limited to the risks of infection, bleeding, nerve injury, cardiopulmonary complications, the need for revision surgery, among others, and the patient was willing to proceed.  OPERATIVE IMPLANTS: none  OPERATIVE FINDINGS: removal  BLOOD LOSS: min  COMPLICATIONS: none  TOURNIQUET TIME: none  OPERATIVE PROCEDURE:  Patient was identified in the preoperative holding area and site was marked by me She was transported to the operating theater and placed on the table in supine position taking care to pad all bony prominences. After a preincinduction time out anesthesia was induced. The right lower extremity was prepped and draped in normal sterile fashion and a pre-incision timeout was performed. She received ancef  for preoperative antibiotics.   I made an incision in her prvious site anddissected to the plate.  I then removed the lateral buttons and plate. I rrigated her and closed the incision.  Next I used fluoroscopy to guide two incisions over the medial buttons and removed these allong with the tight rope strings. I then irrigated here and closed.   Sterile dressings applied.   POST OPERATIVE PLAN: WBAT

## 2023-08-19 NOTE — Discharge Instructions (Addendum)
 POST-OPERATIVE OPIOID TAPER INSTRUCTIONS: It is important to wean off of your opioid medication as soon as possible. If you do not need pain medication after your surgery it is ok to stop day one. Opioids include: Codeine, Hydrocodone (Norco, Vicodin), Oxycodone (Percocet, oxycontin ) and hydromorphone amongst others.  Long term and even short term use of opiods can cause: Increased pain response Dependence Constipation Depression Respiratory depression And more.  Withdrawal symptoms can include Flu like symptoms Nausea, vomiting And more Techniques to manage these symptoms Hydrate well Eat regular healthy meals Stay active Use relaxation techniques(deep breathing, meditating, yoga) Do Not substitute Alcohol to help with tapering If you have been on opioids for less than two weeks and do not have pain than it is ok to stop all together.  Plan to wean off of opioids This plan should start within one week post op of your joint replacement. Maintain the same interval or time between taking each dose and first decrease the dose.  Cut the total daily intake of opioids by one tablet each day Next start to increase the time between doses. The last dose that should be eliminated is the evening dose.     Post Anesthesia Home Care Instructions  Activity: Get plenty of rest for the remainder of the day. A responsible individual must stay with you for 24 hours following the procedure.  For the next 24 hours, DO NOT: -Drive a car -Advertising copywriter -Drink alcoholic beverages -Take any medication unless instructed by your physician -Make any legal decisions or sign important papers.  Meals: Start with liquid foods such as gelatin or soup. Progress to regular foods as tolerated. Avoid greasy, spicy, heavy foods. If nausea and/or vomiting occur, drink only clear liquids until the nausea and/or vomiting subsides. Call your physician if vomiting continues.  Special Instructions/Symptoms: Your  throat may feel dry or sore from the anesthesia or the breathing tube placed in your throat during surgery. If this causes discomfort, gargle with warm salt water. The discomfort should disappear within 24 hours.  If you had a scopolamine patch placed behind your ear for the management of post- operative nausea and/or vomiting:  1. The medication in the patch is effective for 72 hours, after which it should be removed.  Wrap patch in a tissue and discard in the trash. Wash hands thoroughly with soap and water. 2. You may remove the patch earlier than 72 hours if you experience unpleasant side effects which may include dry mouth, dizziness or visual disturbances. 3. Avoid touching the patch. Wash your hands with soap and water after contact with the patch.    May have Tylenol  today after 1:15 PM May have Ibuprofen today after 3:15 PM

## 2023-08-19 NOTE — Anesthesia Preprocedure Evaluation (Signed)
 Anesthesia Evaluation  Patient identified by MRN, date of birth, ID band Patient awake    Reviewed: Allergy & Precautions, H&P , NPO status , Patient's Chart, lab work & pertinent test results  Airway Mallampati: II   Neck ROM: full    Dental   Pulmonary neg pulmonary ROS   breath sounds clear to auscultation       Cardiovascular negative cardio ROS  Rhythm:regular Rate:Normal     Neuro/Psych    GI/Hepatic Esophageal web   Endo/Other    Renal/GU stones     Musculoskeletal   Abdominal   Peds  Hematology   Anesthesia Other Findings   Reproductive/Obstetrics                             Anesthesia Physical Anesthesia Plan  ASA: 1  Anesthesia Plan: General   Post-op Pain Management:    Induction: Intravenous  PONV Risk Score and Plan: 3 and Ondansetron , Dexamethasone , Midazolam  and Treatment may vary due to age or medical condition  Airway Management Planned: LMA  Additional Equipment:   Intra-op Plan:   Post-operative Plan: Extubation in OR  Informed Consent: I have reviewed the patients History and Physical, chart, labs and discussed the procedure including the risks, benefits and alternatives for the proposed anesthesia with the patient or authorized representative who has indicated his/her understanding and acceptance.     Dental advisory given  Plan Discussed with: CRNA, Anesthesiologist and Surgeon  Anesthesia Plan Comments:        Anesthesia Quick Evaluation

## 2023-08-20 ENCOUNTER — Encounter (HOSPITAL_BASED_OUTPATIENT_CLINIC_OR_DEPARTMENT_OTHER): Payer: Self-pay | Admitting: Orthopedic Surgery

## 2023-08-29 DIAGNOSIS — T8484XD Pain due to internal orthopedic prosthetic devices, implants and grafts, subsequent encounter: Secondary | ICD-10-CM | POA: Diagnosis not present

## 2023-09-09 DIAGNOSIS — M9902 Segmental and somatic dysfunction of thoracic region: Secondary | ICD-10-CM | POA: Diagnosis not present

## 2023-09-09 DIAGNOSIS — M5032 Other cervical disc degeneration, mid-cervical region, unspecified level: Secondary | ICD-10-CM | POA: Diagnosis not present

## 2023-09-09 DIAGNOSIS — M542 Cervicalgia: Secondary | ICD-10-CM | POA: Diagnosis not present

## 2023-09-09 DIAGNOSIS — M9901 Segmental and somatic dysfunction of cervical region: Secondary | ICD-10-CM | POA: Diagnosis not present

## 2023-09-10 DIAGNOSIS — M9901 Segmental and somatic dysfunction of cervical region: Secondary | ICD-10-CM | POA: Diagnosis not present

## 2023-09-10 DIAGNOSIS — M5032 Other cervical disc degeneration, mid-cervical region, unspecified level: Secondary | ICD-10-CM | POA: Diagnosis not present

## 2023-09-10 DIAGNOSIS — M9902 Segmental and somatic dysfunction of thoracic region: Secondary | ICD-10-CM | POA: Diagnosis not present

## 2023-09-10 DIAGNOSIS — M542 Cervicalgia: Secondary | ICD-10-CM | POA: Diagnosis not present

## 2023-09-11 DIAGNOSIS — M9901 Segmental and somatic dysfunction of cervical region: Secondary | ICD-10-CM | POA: Diagnosis not present

## 2023-09-11 DIAGNOSIS — M9902 Segmental and somatic dysfunction of thoracic region: Secondary | ICD-10-CM | POA: Diagnosis not present

## 2023-09-11 DIAGNOSIS — M5032 Other cervical disc degeneration, mid-cervical region, unspecified level: Secondary | ICD-10-CM | POA: Diagnosis not present

## 2023-09-11 DIAGNOSIS — M542 Cervicalgia: Secondary | ICD-10-CM | POA: Diagnosis not present

## 2023-09-12 DIAGNOSIS — M9901 Segmental and somatic dysfunction of cervical region: Secondary | ICD-10-CM | POA: Diagnosis not present

## 2023-09-12 DIAGNOSIS — M542 Cervicalgia: Secondary | ICD-10-CM | POA: Diagnosis not present

## 2023-09-12 DIAGNOSIS — M9902 Segmental and somatic dysfunction of thoracic region: Secondary | ICD-10-CM | POA: Diagnosis not present

## 2023-09-12 DIAGNOSIS — M5032 Other cervical disc degeneration, mid-cervical region, unspecified level: Secondary | ICD-10-CM | POA: Diagnosis not present

## 2023-09-15 DIAGNOSIS — M5032 Other cervical disc degeneration, mid-cervical region, unspecified level: Secondary | ICD-10-CM | POA: Diagnosis not present

## 2023-09-15 DIAGNOSIS — M9901 Segmental and somatic dysfunction of cervical region: Secondary | ICD-10-CM | POA: Diagnosis not present

## 2023-09-15 DIAGNOSIS — M542 Cervicalgia: Secondary | ICD-10-CM | POA: Diagnosis not present

## 2023-09-15 DIAGNOSIS — M9902 Segmental and somatic dysfunction of thoracic region: Secondary | ICD-10-CM | POA: Diagnosis not present

## 2023-09-18 DIAGNOSIS — M542 Cervicalgia: Secondary | ICD-10-CM | POA: Diagnosis not present

## 2023-09-18 DIAGNOSIS — M5032 Other cervical disc degeneration, mid-cervical region, unspecified level: Secondary | ICD-10-CM | POA: Diagnosis not present

## 2023-09-18 DIAGNOSIS — M9901 Segmental and somatic dysfunction of cervical region: Secondary | ICD-10-CM | POA: Diagnosis not present

## 2023-09-18 DIAGNOSIS — M9902 Segmental and somatic dysfunction of thoracic region: Secondary | ICD-10-CM | POA: Diagnosis not present

## 2023-09-19 DIAGNOSIS — M9902 Segmental and somatic dysfunction of thoracic region: Secondary | ICD-10-CM | POA: Diagnosis not present

## 2023-09-19 DIAGNOSIS — M542 Cervicalgia: Secondary | ICD-10-CM | POA: Diagnosis not present

## 2023-09-19 DIAGNOSIS — M5032 Other cervical disc degeneration, mid-cervical region, unspecified level: Secondary | ICD-10-CM | POA: Diagnosis not present

## 2023-09-19 DIAGNOSIS — M9901 Segmental and somatic dysfunction of cervical region: Secondary | ICD-10-CM | POA: Diagnosis not present

## 2023-09-23 DIAGNOSIS — M5032 Other cervical disc degeneration, mid-cervical region, unspecified level: Secondary | ICD-10-CM | POA: Diagnosis not present

## 2023-09-23 DIAGNOSIS — M542 Cervicalgia: Secondary | ICD-10-CM | POA: Diagnosis not present

## 2023-09-23 DIAGNOSIS — M9902 Segmental and somatic dysfunction of thoracic region: Secondary | ICD-10-CM | POA: Diagnosis not present

## 2023-09-23 DIAGNOSIS — M9901 Segmental and somatic dysfunction of cervical region: Secondary | ICD-10-CM | POA: Diagnosis not present

## 2023-09-25 DIAGNOSIS — M5032 Other cervical disc degeneration, mid-cervical region, unspecified level: Secondary | ICD-10-CM | POA: Diagnosis not present

## 2023-09-25 DIAGNOSIS — M9902 Segmental and somatic dysfunction of thoracic region: Secondary | ICD-10-CM | POA: Diagnosis not present

## 2023-09-25 DIAGNOSIS — M542 Cervicalgia: Secondary | ICD-10-CM | POA: Diagnosis not present

## 2023-09-25 DIAGNOSIS — M9901 Segmental and somatic dysfunction of cervical region: Secondary | ICD-10-CM | POA: Diagnosis not present

## 2023-09-26 DIAGNOSIS — M5032 Other cervical disc degeneration, mid-cervical region, unspecified level: Secondary | ICD-10-CM | POA: Diagnosis not present

## 2023-09-26 DIAGNOSIS — M542 Cervicalgia: Secondary | ICD-10-CM | POA: Diagnosis not present

## 2023-09-26 DIAGNOSIS — M9901 Segmental and somatic dysfunction of cervical region: Secondary | ICD-10-CM | POA: Diagnosis not present

## 2023-09-26 DIAGNOSIS — M9902 Segmental and somatic dysfunction of thoracic region: Secondary | ICD-10-CM | POA: Diagnosis not present

## 2023-09-30 DIAGNOSIS — M5032 Other cervical disc degeneration, mid-cervical region, unspecified level: Secondary | ICD-10-CM | POA: Diagnosis not present

## 2023-09-30 DIAGNOSIS — M9902 Segmental and somatic dysfunction of thoracic region: Secondary | ICD-10-CM | POA: Diagnosis not present

## 2023-09-30 DIAGNOSIS — M9901 Segmental and somatic dysfunction of cervical region: Secondary | ICD-10-CM | POA: Diagnosis not present

## 2023-09-30 DIAGNOSIS — M542 Cervicalgia: Secondary | ICD-10-CM | POA: Diagnosis not present

## 2023-10-02 DIAGNOSIS — M9902 Segmental and somatic dysfunction of thoracic region: Secondary | ICD-10-CM | POA: Diagnosis not present

## 2023-10-02 DIAGNOSIS — M5032 Other cervical disc degeneration, mid-cervical region, unspecified level: Secondary | ICD-10-CM | POA: Diagnosis not present

## 2023-10-02 DIAGNOSIS — M542 Cervicalgia: Secondary | ICD-10-CM | POA: Diagnosis not present

## 2023-10-02 DIAGNOSIS — M9901 Segmental and somatic dysfunction of cervical region: Secondary | ICD-10-CM | POA: Diagnosis not present

## 2023-10-03 DIAGNOSIS — M9901 Segmental and somatic dysfunction of cervical region: Secondary | ICD-10-CM | POA: Diagnosis not present

## 2023-10-03 DIAGNOSIS — M5032 Other cervical disc degeneration, mid-cervical region, unspecified level: Secondary | ICD-10-CM | POA: Diagnosis not present

## 2023-10-03 DIAGNOSIS — M9902 Segmental and somatic dysfunction of thoracic region: Secondary | ICD-10-CM | POA: Diagnosis not present

## 2023-10-03 DIAGNOSIS — M542 Cervicalgia: Secondary | ICD-10-CM | POA: Diagnosis not present

## 2023-10-07 DIAGNOSIS — M9902 Segmental and somatic dysfunction of thoracic region: Secondary | ICD-10-CM | POA: Diagnosis not present

## 2023-10-07 DIAGNOSIS — M9901 Segmental and somatic dysfunction of cervical region: Secondary | ICD-10-CM | POA: Diagnosis not present

## 2023-10-07 DIAGNOSIS — M5032 Other cervical disc degeneration, mid-cervical region, unspecified level: Secondary | ICD-10-CM | POA: Diagnosis not present

## 2023-10-07 DIAGNOSIS — M542 Cervicalgia: Secondary | ICD-10-CM | POA: Diagnosis not present

## 2023-10-09 DIAGNOSIS — M9902 Segmental and somatic dysfunction of thoracic region: Secondary | ICD-10-CM | POA: Diagnosis not present

## 2023-10-09 DIAGNOSIS — M9901 Segmental and somatic dysfunction of cervical region: Secondary | ICD-10-CM | POA: Diagnosis not present

## 2023-10-09 DIAGNOSIS — M5032 Other cervical disc degeneration, mid-cervical region, unspecified level: Secondary | ICD-10-CM | POA: Diagnosis not present

## 2023-10-09 DIAGNOSIS — M542 Cervicalgia: Secondary | ICD-10-CM | POA: Diagnosis not present

## 2023-10-15 DIAGNOSIS — M542 Cervicalgia: Secondary | ICD-10-CM | POA: Diagnosis not present

## 2023-10-15 DIAGNOSIS — M9902 Segmental and somatic dysfunction of thoracic region: Secondary | ICD-10-CM | POA: Diagnosis not present

## 2023-10-15 DIAGNOSIS — M9901 Segmental and somatic dysfunction of cervical region: Secondary | ICD-10-CM | POA: Diagnosis not present

## 2023-10-15 DIAGNOSIS — M5032 Other cervical disc degeneration, mid-cervical region, unspecified level: Secondary | ICD-10-CM | POA: Diagnosis not present

## 2023-10-17 DIAGNOSIS — M5032 Other cervical disc degeneration, mid-cervical region, unspecified level: Secondary | ICD-10-CM | POA: Diagnosis not present

## 2023-10-17 DIAGNOSIS — M9902 Segmental and somatic dysfunction of thoracic region: Secondary | ICD-10-CM | POA: Diagnosis not present

## 2023-10-17 DIAGNOSIS — M9901 Segmental and somatic dysfunction of cervical region: Secondary | ICD-10-CM | POA: Diagnosis not present

## 2023-10-17 DIAGNOSIS — M542 Cervicalgia: Secondary | ICD-10-CM | POA: Diagnosis not present

## 2023-10-21 DIAGNOSIS — M9901 Segmental and somatic dysfunction of cervical region: Secondary | ICD-10-CM | POA: Diagnosis not present

## 2023-10-21 DIAGNOSIS — M9902 Segmental and somatic dysfunction of thoracic region: Secondary | ICD-10-CM | POA: Diagnosis not present

## 2023-10-21 DIAGNOSIS — M5032 Other cervical disc degeneration, mid-cervical region, unspecified level: Secondary | ICD-10-CM | POA: Diagnosis not present

## 2023-10-21 DIAGNOSIS — M542 Cervicalgia: Secondary | ICD-10-CM | POA: Diagnosis not present

## 2023-10-23 DIAGNOSIS — M9901 Segmental and somatic dysfunction of cervical region: Secondary | ICD-10-CM | POA: Diagnosis not present

## 2023-10-23 DIAGNOSIS — M9902 Segmental and somatic dysfunction of thoracic region: Secondary | ICD-10-CM | POA: Diagnosis not present

## 2023-10-23 DIAGNOSIS — M542 Cervicalgia: Secondary | ICD-10-CM | POA: Diagnosis not present

## 2023-10-23 DIAGNOSIS — M5032 Other cervical disc degeneration, mid-cervical region, unspecified level: Secondary | ICD-10-CM | POA: Diagnosis not present

## 2023-11-26 DIAGNOSIS — Z13220 Encounter for screening for lipoid disorders: Secondary | ICD-10-CM | POA: Diagnosis not present

## 2023-11-26 DIAGNOSIS — E6609 Other obesity due to excess calories: Secondary | ICD-10-CM | POA: Diagnosis not present

## 2023-11-26 DIAGNOSIS — Z0000 Encounter for general adult medical examination without abnormal findings: Secondary | ICD-10-CM | POA: Diagnosis not present

## 2023-11-26 DIAGNOSIS — R5383 Other fatigue: Secondary | ICD-10-CM | POA: Diagnosis not present

## 2023-11-26 DIAGNOSIS — Z6833 Body mass index (BMI) 33.0-33.9, adult: Secondary | ICD-10-CM | POA: Diagnosis not present

## 2023-11-26 DIAGNOSIS — R0602 Shortness of breath: Secondary | ICD-10-CM | POA: Diagnosis not present

## 2023-11-26 DIAGNOSIS — Z131 Encounter for screening for diabetes mellitus: Secondary | ICD-10-CM | POA: Diagnosis not present

## 2023-11-26 DIAGNOSIS — Z6832 Body mass index (BMI) 32.0-32.9, adult: Secondary | ICD-10-CM | POA: Diagnosis not present

## 2023-12-04 DIAGNOSIS — J029 Acute pharyngitis, unspecified: Secondary | ICD-10-CM | POA: Diagnosis not present

## 2023-12-04 DIAGNOSIS — Z6838 Body mass index (BMI) 38.0-38.9, adult: Secondary | ICD-10-CM | POA: Diagnosis not present

## 2023-12-10 DIAGNOSIS — T8484XD Pain due to internal orthopedic prosthetic devices, implants and grafts, subsequent encounter: Secondary | ICD-10-CM | POA: Diagnosis not present

## 2024-01-21 DIAGNOSIS — G5751 Tarsal tunnel syndrome, right lower limb: Secondary | ICD-10-CM | POA: Diagnosis not present

## 2024-03-18 DIAGNOSIS — N951 Menopausal and female climacteric states: Secondary | ICD-10-CM | POA: Diagnosis not present

## 2024-03-18 DIAGNOSIS — Z01419 Encounter for gynecological examination (general) (routine) without abnormal findings: Secondary | ICD-10-CM | POA: Diagnosis not present
